# Patient Record
Sex: Male | Born: 1975
Health system: Southern US, Academic
[De-identification: ages and names within clinical notes are randomized; demographics above are authoritative.]

## PROBLEM LIST (undated history)

## (undated) ENCOUNTER — Encounter

## (undated) ENCOUNTER — Ambulatory Visit

## (undated) DIAGNOSIS — L309 Dermatitis, unspecified: Secondary | ICD-10-CM

## (undated) DIAGNOSIS — F32A Depression, unspecified: Secondary | ICD-10-CM

## (undated) DIAGNOSIS — F419 Anxiety disorder, unspecified: Secondary | ICD-10-CM

## (undated) DIAGNOSIS — F329 Major depressive disorder, single episode, unspecified: Secondary | ICD-10-CM

## (undated) DIAGNOSIS — J45909 Unspecified asthma, uncomplicated: Secondary | ICD-10-CM

## (undated) HISTORY — DX: Major depressive disorder, single episode, unspecified: F32.9

## (undated) HISTORY — DX: Anxiety disorder, unspecified: F41.9

## (undated) HISTORY — DX: Unspecified asthma, uncomplicated: J45.909

## (undated) HISTORY — DX: Depression, unspecified: F32.A

## (undated) HISTORY — DX: Dermatitis, unspecified: L30.9

---

## 2008-02-08 ENCOUNTER — Emergency Department (HOSPITAL_COMMUNITY): Admission: EM | Admit: 2008-02-08 | Discharge: 2008-02-08 | Payer: Self-pay | Admitting: Emergency Medicine

## 2010-04-28 LAB — POCT I-STAT, CHEM 8
BUN: 15 mg/dL (ref 6–23)
Calcium, Ion: 1.16 mmol/L (ref 1.12–1.32)
Chloride: 106 mEq/L (ref 96–112)
Glucose, Bld: 94 mg/dL (ref 70–99)
TCO2: 26 mmol/L (ref 0–100)

## 2012-06-07 ENCOUNTER — Telehealth: Payer: Self-pay | Admitting: Physician Assistant

## 2012-06-07 MED ORDER — SERTRALINE HCL 50 MG PO TABS: 50.0000 mg | ORAL_TABLET | Freq: Every day | ORAL | Status: DC

## 2012-06-07 NOTE — Telephone Encounter (Signed)
Pt has not been seen in over 1 year.  He was called and appt was made for this Friday.  #30 called in so pt will not run out.

## 2012-06-10 ENCOUNTER — Ambulatory Visit: Payer: BC Managed Care – PPO | Admitting: Family Medicine

## 2012-07-13 ENCOUNTER — Encounter: Payer: Self-pay | Admitting: Physician Assistant

## 2012-07-13 ENCOUNTER — Ambulatory Visit (INDEPENDENT_AMBULATORY_CARE_PROVIDER_SITE_OTHER): Payer: BC Managed Care – PPO | Admitting: Physician Assistant

## 2012-07-13 VITALS — BP 146/90 | HR 72 | Temp 98.0°F | Resp 16 | Ht 66.0 in | Wt 186.0 lb

## 2012-07-13 DIAGNOSIS — J4530 Mild persistent asthma, uncomplicated: Secondary | ICD-10-CM

## 2012-07-13 DIAGNOSIS — F32A Depression, unspecified: Secondary | ICD-10-CM | POA: Insufficient documentation

## 2012-07-13 DIAGNOSIS — J45909 Unspecified asthma, uncomplicated: Secondary | ICD-10-CM | POA: Insufficient documentation

## 2012-07-13 DIAGNOSIS — F329 Major depressive disorder, single episode, unspecified: Secondary | ICD-10-CM

## 2012-07-13 DIAGNOSIS — F419 Anxiety disorder, unspecified: Secondary | ICD-10-CM | POA: Insufficient documentation

## 2012-07-13 DIAGNOSIS — F411 Generalized anxiety disorder: Secondary | ICD-10-CM

## 2012-07-13 DIAGNOSIS — I10 Essential (primary) hypertension: Secondary | ICD-10-CM | POA: Insufficient documentation

## 2012-07-13 DIAGNOSIS — L259 Unspecified contact dermatitis, unspecified cause: Secondary | ICD-10-CM

## 2012-07-13 DIAGNOSIS — L309 Dermatitis, unspecified: Secondary | ICD-10-CM | POA: Insufficient documentation

## 2012-07-13 MED ORDER — TRIAMCINOLONE ACETONIDE 0.5 % EX CREA: TOPICAL_CREAM | Freq: Three times a day (TID) | CUTANEOUS | Status: DC

## 2012-07-13 MED ORDER — ALBUTEROL SULFATE HFA 108 (90 BASE) MCG/ACT IN AERS: 2.0000 | INHALATION_SPRAY | Freq: Four times a day (QID) | RESPIRATORY_TRACT | Status: DC | PRN

## 2012-07-13 MED ORDER — BUDESONIDE-FORMOTEROL FUMARATE 80-4.5 MCG/ACT IN AERO: 2.0000 | INHALATION_SPRAY | Freq: Two times a day (BID) | RESPIRATORY_TRACT | Status: DC

## 2012-07-13 MED ORDER — SERTRALINE HCL 50 MG PO TABS: 50.0000 mg | ORAL_TABLET | Freq: Every day | ORAL | Status: DC

## 2012-07-13 NOTE — Progress Notes (Signed)
Patient ID: Andres Jennings MRN: [id number], DOB: 11-05-1975, 37 y.o. Date of Encounter: @DATE @  Chief Complaint:  Chief Complaint  Patient presents with  . Medication Refill    HPI: 37 y.o. year old white male  presents for routine f/u. Hi LOV was 03/2011.   1- HTN: In past he had HTN.  He did diet, exercise, lost weight, and his BP became controlled. He notes that today his weight and BP are back up. Admits that he ahs slacked off his diet and exercise.  2-Anxiety/Depression: Is on Zoloft 50mg  QD. This is working well. This controls his mood. He is having no adv effects. He does not want to try going off the med again. He had stoppe dit in past but had to restart it in 02/2011.   3- Asthma:; He does use Symbicort routinely during spring and fall, "allergy season". He rarely has to use albuterol-almost never.   4-Eczema: Uses Rx cream to areas as needed.   Past Medical History  Diagnosis Date  . Anxiety   . Depression   . Asthma   . Eczema      Home Meds: See attached medication section for current medication list. Any medications entered into computer today will not appear on this note's list. The medications listed below were entered prior to today. No current outpatient prescriptions on file prior to visit.   No current facility-administered medications on file prior to visit.    Allergies: No Known Allergies  History   Social History  . Marital Status: Married    Spouse Name: N/A    Number of Children: N/A  . Years of Education: N/A   Occupational History  . Not on file.   Social History Main Topics  . Smoking status: Never Smoker   . Smokeless tobacco: Not on file  . Alcohol Use: Yes     Comment: Occasional  . Drug Use: No  . Sexually Active: Not on file   Other Topics Concern  . Not on file   Social History Narrative  . No narrative on file    No family history on file.   Review of Systems:  See HPI for pertinent ROS. All other ROS negative.     Physical Exam: Blood pressure 146/90, pulse 72, temperature 98 F (36.7 C), temperature source Oral, resp. rate 16, height 5\' 6"  (1.676 m), weight 186 lb (84.369 kg)., Body mass index is 30.04 kg/(m^2). General:WNWD WM. Appears in no acute distress. Neck: Supple. No thyromegaly. No lymphadenopathy. No carotid bruit. Lungs: Clear bilaterally to auscultation without wheezes, rales, or rhonchi. Breathing is unlabored. Heart: RRR with S1 S2. No murmurs, rubs, or gallops. Musculoskeletal:  Strength and tone normal for age. Extremities/Skin: Warm and dry. No LE edema. No active eczema at present. Neuro: Alert and oriented X 3. Moves all extremities spontaneously. Gait is normal. CNII-XII grossly in tact. Psych:  Responds to questions appropriately with a normal affect.     ASSESSMENT AND PLAN:  37 y.o. year old male with  1. Anxiety Stable/controlled. Cont current med. - sertraline (ZOLOFT) 50 MG tablet; Take 1 tablet (50 mg total) by mouth daily.  Dispense: 30 tablet; Refill: 11  2. Depression Stable/controlled. Cont current med.  - sertraline (ZOLOFT) 50 MG tablet; Take 1 tablet (50 mg total) by mouth daily.  Dispense: 30 tablet; Refill: 11  3. HTN (hypertension) He wants to treat with diet, exercise. He says it is realistic that he can do this long term. He will  make these changes then come in for f/u OV in 2 months to recheck.   4. Asthma, mild persistent, uncomplicated Stable/controlled. Cont current meds.  - budesonide-formoterol (SYMBICORT) 80-4.5 MCG/ACT inhaler; Inhale 2 puffs into the lungs 2 (two) times daily.  Dispense: 1 Inhaler; Refill: 11 - albuterol (PROVENTIL HFA;VENTOLIN HFA) 108 (90 BASE) MCG/ACT inhaler; Inhale 2 puffs into the lungs every 6 (six) hours as needed for wheezing.  Dispense: 1 Inhaler; Refill: 0  5. Eczema - triamcinolone cream (KENALOG) 0.5 %; Apply topically 3 (three) times daily.  Dispense: 30 g; Refill: 3  FLP was ok 06/2009.   [address] [place], [place], [facility] 07/13/2012 8:34 AM

## 2012-09-14 ENCOUNTER — Ambulatory Visit: Payer: BC Managed Care – PPO | Admitting: Physician Assistant

## 2012-12-23 ENCOUNTER — Other Ambulatory Visit: Payer: Self-pay | Admitting: Physician Assistant

## 2013-06-21 ENCOUNTER — Other Ambulatory Visit: Payer: BC Managed Care – PPO

## 2013-06-22 ENCOUNTER — Encounter: Payer: Self-pay | Admitting: Family Medicine

## 2013-06-22 ENCOUNTER — Ambulatory Visit (INDEPENDENT_AMBULATORY_CARE_PROVIDER_SITE_OTHER): Payer: BC Managed Care – PPO | Admitting: Family Medicine

## 2013-06-22 VITALS — BP 140/94 | HR 80 | Temp 98.4°F | Resp 16 | Ht 66.0 in | Wt 174.0 lb

## 2013-06-22 DIAGNOSIS — F411 Generalized anxiety disorder: Secondary | ICD-10-CM

## 2013-06-22 DIAGNOSIS — L309 Dermatitis, unspecified: Secondary | ICD-10-CM

## 2013-06-22 DIAGNOSIS — F3289 Other specified depressive episodes: Secondary | ICD-10-CM

## 2013-06-22 DIAGNOSIS — L259 Unspecified contact dermatitis, unspecified cause: Secondary | ICD-10-CM

## 2013-06-22 DIAGNOSIS — Z13220 Encounter for screening for lipoid disorders: Secondary | ICD-10-CM

## 2013-06-22 DIAGNOSIS — F32A Depression, unspecified: Secondary | ICD-10-CM

## 2013-06-22 DIAGNOSIS — F329 Major depressive disorder, single episode, unspecified: Secondary | ICD-10-CM

## 2013-06-22 DIAGNOSIS — F419 Anxiety disorder, unspecified: Secondary | ICD-10-CM

## 2013-06-22 MED ORDER — BUDESONIDE-FORMOTEROL FUMARATE 80-4.5 MCG/ACT IN AERO: 2.0000 | INHALATION_SPRAY | Freq: Two times a day (BID) | RESPIRATORY_TRACT | Status: DC

## 2013-06-22 MED ORDER — TRIAMCINOLONE ACETONIDE 0.1 % EX CREA: 1.0000 "application " | TOPICAL_CREAM | Freq: Two times a day (BID) | CUTANEOUS | Status: DC

## 2013-06-22 MED ORDER — SERTRALINE HCL 50 MG PO TABS: 50.0000 mg | ORAL_TABLET | Freq: Every day | ORAL | Status: DC

## 2013-06-22 NOTE — Progress Notes (Signed)
Subjective:    Patient ID: Andres Jennings, male    DOB: [birth year], 38 y.o.   MRN: [id number]  HPI Patient has a history of eczema. He uses triamcinolone cream twice daily for 3 or 4 days out of the month. He then uses moisturizers on a daily basis. He like a refill on triamcinolone. He also has a history of asthma. He Symbicort 80/40.52 puffs inhaled twice a day. This is working well. He uses albuterol less than 2 nights out of the month and less than 2 times a week. He also has a history of anxiety disorder and panic attacks. He is Zoloft 50 mg by mouth daily. On this dose he has not had a panic attack in over a year. He would like to continue that.  His blood pressure is elevated today. When I rechecked his blood pressure was 138/80. The patient has an element of white coat syndrome. His blood pressure at home is much better 110-120 over 70s. He checks it on a daily basis. He requests a screening cholesterol level. He has a family history of heart disease. Dad had MI in 2340's. Past Medical History  Diagnosis Date  . Anxiety   . Depression   . Asthma   . Eczema    Current Outpatient Prescriptions on File Prior to Visit  Medication Sig Dispense Refill  . albuterol (PROVENTIL HFA;VENTOLIN HFA) 108 (90 BASE) MCG/ACT inhaler Inhale 2 puffs into the lungs every 6 (six) hours as needed for wheezing.  1 Inhaler  0  . triamcinolone cream (KENALOG) 0.5 % APPLY 3 TIMES A DAY  30 g  3   No current facility-administered medications on file prior to visit.   No Known Allergies History   Social History  . Marital Status: Married    Spouse Name: N/A    Number of Children: N/A  . Years of Education: N/A   Occupational History  . Not on file.   Social History Main Topics  . Smoking status: Never Smoker   . Smokeless tobacco: Not on file  . Alcohol Use: Yes     Comment: Occasional  . Drug Use: No  . Sexual Activity: Not on file   Other Topics Concern  . Not on file   Social History  Narrative  . No narrative on file   No family history on file.    Review of Systems  All other systems reviewed and are negative.      Objective:   Physical Exam  Vitals reviewed. Cardiovascular: Normal rate, regular rhythm and normal heart sounds.   Pulmonary/Chest: Effort normal and breath sounds normal. No respiratory distress. He has no wheezes. He has no rales.  Abdominal: Soft. Bowel sounds are normal. He exhibits no distension. There is no tenderness. There is no rebound and no guarding.  Skin: Rash noted. There is erythema.          Assessment & Plan:  1. Eczema Patient can use triamcinolone cream up to 2 pounds a day as needed for eczema. I cautioned him against overuse of this medication. I explained that this can cause thinning of the skin - triamcinolone cream (KENALOG) 0.1 %; Apply 1 application topically 2 (two) times daily.  Dispense: 454 g; Refill: 0  2. Anxiety - sertraline (ZOLOFT) 50 MG tablet; Take 1 tablet (50 mg total) by mouth daily.  Dispense: 90 tablet; Refill: 3  3. Depression Patient like to continue Zoloft 50 mg by mouth daily because it is working well.  He will continue to check his blood pressure on the daily basis at home. He will notify me if his blood pressure is consistently greater than 140/90. - sertraline (ZOLOFT) 50 MG tablet; Take 1 tablet (50 mg total) by mouth daily.  Dispense: 90 tablet; Refill: 3  4. Screening cholesterol level Return fasting for a fasting lipid panel. - COMPLETE METABOLIC PANEL WITH GFR; Future - Lipid panel; Future 5. Asthma- I refilled the patient's Symbicort. His asthma is currently well controlled

## 2013-07-25 ENCOUNTER — Other Ambulatory Visit: Payer: Self-pay | Admitting: Physician Assistant

## 2013-07-25 NOTE — Telephone Encounter (Signed)
Refill appropriate and filled per protocol. 

## 2013-09-19 ENCOUNTER — Ambulatory Visit (INDEPENDENT_AMBULATORY_CARE_PROVIDER_SITE_OTHER): Payer: BC Managed Care – PPO | Admitting: Family Medicine

## 2013-09-19 ENCOUNTER — Encounter: Payer: Self-pay | Admitting: Family Medicine

## 2013-09-19 VITALS — BP 134/82 | HR 82 | Temp 98.0°F | Resp 18 | Ht 66.0 in | Wt 173.0 lb

## 2013-09-19 DIAGNOSIS — H6980 Other specified disorders of Eustachian tube, unspecified ear: Secondary | ICD-10-CM

## 2013-09-19 DIAGNOSIS — H6500 Acute serous otitis media, unspecified ear: Secondary | ICD-10-CM

## 2013-09-19 DIAGNOSIS — H6503 Acute serous otitis media, bilateral: Secondary | ICD-10-CM

## 2013-09-19 DIAGNOSIS — H6983 Other specified disorders of Eustachian tube, bilateral: Secondary | ICD-10-CM

## 2013-09-19 MED ORDER — PREDNISONE 20 MG PO TABS: ORAL_TABLET | ORAL | Status: DC

## 2013-09-19 MED ORDER — CEFDINIR 300 MG PO CAPS: 300.0000 mg | ORAL_CAPSULE | Freq: Two times a day (BID) | ORAL | Status: DC

## 2013-09-19 NOTE — Progress Notes (Signed)
Subjective:    Patient ID: Andres Jennings, male    DOB: Mar 18, 1975, 38 y.o.   MRN: [id number]  HPI 2 weeks ago, the patient developed upper respiratory infection.  It is characterized by runny nose, sore throat, nonproductive cough. Patient took over-the-counter medications for symptoms.  However, starting Friday, the patient developed bilateral hearing impairment.  He states that both ears feel full like he is underwater.  Voices and sounds are muffled.  He can hear them but they are diminished.  [facility] he was diagnosed with bilateral ear infections and was given amoxicillin.  He's been on the medication now 4 days but symptoms have not improved.  He denies dizziness. He is having some mild tinnitus. He also reports sinus pressure and drainage. Past Medical History  Diagnosis Date  . Anxiety   . Depression   . Asthma   . Eczema    No past surgical history on file. Current Outpatient Prescriptions on File Prior to Visit  Medication Sig Dispense Refill  . sertraline (ZOLOFT) 50 MG tablet TAKE 1 TABLET EVERY DAY  30 tablet  2  . SYMBICORT 80-4.5 MCG/ACT inhaler INHALE 2 PUFFS INTO THE LUNGS 2 (TWO) TIMES DAILY.  10.2 g  3  . triamcinolone cream (KENALOG) 0.1 % Apply 1 application topically 2 (two) times daily.  454 g  0  . albuterol (PROVENTIL HFA;VENTOLIN HFA) 108 (90 BASE) MCG/ACT inhaler Inhale 2 puffs into the lungs every 6 (six) hours as needed for wheezing.  1 Inhaler  0   No current facility-administered medications on file prior to visit.   No Known Allergies History   Social History  . Marital Status: Married    Spouse Name: N/A    Number of Children: N/A  . Years of Education: N/A   Occupational History  . Not on file.   Social History Main Topics  . Smoking status: Never Smoker   . Smokeless tobacco: Not on file  . Alcohol Use: Yes     Comment: Occasional  . Drug Use: No  . Sexual Activity: Not on file   Other Topics Concern  . Not on  file   Social History Narrative  . No narrative on file      Review of Systems  All other systems reviewed and are negative.      Objective:   Physical Exam  Vitals reviewed. Constitutional: He appears well-developed and well-nourished.  HENT:  Right Ear: External ear and ear canal normal. Tympanic membrane is injected and erythematous. A middle ear effusion is present. Decreased hearing is noted.  Left Ear: External ear and ear canal normal. Tympanic membrane is injected and erythematous. A middle ear effusion is present. Decreased hearing is noted.  Mouth/Throat: Oropharynx is clear and moist. No oropharyngeal exudate.  Eyes: Conjunctivae are normal. No scleral icterus.  Neck: Neck supple.  Cardiovascular: Normal rate, regular rhythm and normal heart sounds.   Pulmonary/Chest: Effort normal and breath sounds normal. No respiratory distress. He has no wheezes. He has no rales.  Lymphadenopathy:    He has no cervical adenopathy.          Assessment & Plan:  Bilateral acute serous otitis media, recurrence not specified - Plan: cefdinir (OMNICEF) 300 MG capsule, DISCONTINUED: cefdinir (OMNICEF) 300 MG capsule  Eustachian tube dysfunction, bilateral - Plan: predniSONE (DELTASONE) 20 MG tablet, DISCONTINUED: predniSONE (DELTASONE) 20 MG tablet  Extend antibiotic coverage with Omnicef and have the patient discontinue amoxicillin. He  will take Omnicef 300 mg by mouth twice a day for 10 days. I also encouraged him to take Sudafed 60 mg every 6 hours.  Allso start the patient on prednisone taper pack to try to help with eustachian tube dysfunction by decreasing sinus inflammation and swelling. The patient has been taking Flonase without benefit. Recheck in one week and if no better consult ENT.  I do not believe the patient has sudden sense and no hearing loss the patient still has hearing in his left ear albeit diminished.

## 2013-10-06 ENCOUNTER — Telehealth: Payer: Self-pay | Admitting: Family Medicine

## 2013-10-06 DIAGNOSIS — J4530 Mild persistent asthma, uncomplicated: Secondary | ICD-10-CM

## 2013-10-06 MED ORDER — ALBUTEROL SULFATE HFA 108 (90 BASE) MCG/ACT IN AERS: 2.0000 | INHALATION_SPRAY | Freq: Four times a day (QID) | RESPIRATORY_TRACT | Status: DC | PRN

## 2013-10-06 NOTE — Telephone Encounter (Signed)
Medication refilled per protocol. 

## 2013-10-20 ENCOUNTER — Telehealth: Payer: Self-pay | Admitting: Family Medicine

## 2013-10-20 NOTE — Telephone Encounter (Signed)
States he discussed with you a few weeks ago at office visit.  Traveling out of country ([place] next week.  Wants Ambien to help him sleep.  You said you could do??

## 2013-10-23 MED ORDER — ZOLPIDEM TARTRATE 10 MG PO TABS: 10.0000 mg | ORAL_TABLET | Freq: Every evening | ORAL | Status: DC | PRN

## 2013-10-23 NOTE — Telephone Encounter (Signed)
Ok with ambien 10 mg poqhs prn #30

## 2013-10-23 NOTE — Telephone Encounter (Signed)
Rx called in.  Left pt message Rx called in

## 2013-11-22 ENCOUNTER — Other Ambulatory Visit: Payer: Self-pay | Admitting: Family Medicine

## 2013-11-23 NOTE — Telephone Encounter (Signed)
Refill appropriate and filled per protocol. 

## 2014-04-22 ENCOUNTER — Other Ambulatory Visit: Payer: Self-pay | Admitting: Family Medicine

## 2014-08-06 ENCOUNTER — Other Ambulatory Visit: Payer: Self-pay | Admitting: Family Medicine

## 2014-09-01 ENCOUNTER — Other Ambulatory Visit: Payer: Self-pay | Admitting: Family Medicine

## 2015-02-06 ENCOUNTER — Other Ambulatory Visit: Payer: Self-pay | Admitting: Family Medicine

## 2015-02-06 MED ORDER — TRIAMCINOLONE ACETONIDE 0.1 % EX CREA: TOPICAL_CREAM | CUTANEOUS | Status: DC

## 2015-02-06 NOTE — Telephone Encounter (Signed)
Medication called/sent to requested pharmacy  

## 2015-05-14 ENCOUNTER — Other Ambulatory Visit: Payer: Self-pay | Admitting: Family Medicine

## 2015-05-14 NOTE — Telephone Encounter (Signed)
Medication filled x1 with no refills.   Requires office visit before any further refills can be given.   Letter sent.  

## 2015-05-29 ENCOUNTER — Other Ambulatory Visit: Payer: Self-pay | Admitting: *Deleted

## 2015-05-29 MED ORDER — TRIAMCINOLONE ACETONIDE 0.1 % EX CREA: TOPICAL_CREAM | CUTANEOUS | Status: DC

## 2015-05-29 NOTE — Telephone Encounter (Signed)
Received fax requesting refill on Triamcinolone.   Refill appropriate and filled per protocol.

## 2015-06-18 ENCOUNTER — Other Ambulatory Visit: Payer: Self-pay | Admitting: Family Medicine

## 2015-06-24 ENCOUNTER — Ambulatory Visit (INDEPENDENT_AMBULATORY_CARE_PROVIDER_SITE_OTHER): Payer: BLUE CROSS/BLUE SHIELD | Admitting: Family Medicine

## 2015-06-24 ENCOUNTER — Encounter: Payer: Self-pay | Admitting: Family Medicine

## 2015-06-24 VITALS — BP 132/78 | HR 68 | Temp 98.1°F | Resp 16 | Wt 169.0 lb

## 2015-06-24 DIAGNOSIS — I10 Essential (primary) hypertension: Secondary | ICD-10-CM

## 2015-06-24 DIAGNOSIS — J4530 Mild persistent asthma, uncomplicated: Secondary | ICD-10-CM

## 2015-06-24 DIAGNOSIS — F411 Generalized anxiety disorder: Secondary | ICD-10-CM | POA: Diagnosis not present

## 2015-06-24 MED ORDER — ALBUTEROL SULFATE HFA 108 (90 BASE) MCG/ACT IN AERS: 2.0000 | INHALATION_SPRAY | Freq: Four times a day (QID) | RESPIRATORY_TRACT | Status: DC | PRN

## 2015-06-24 MED ORDER — SERTRALINE HCL 50 MG PO TABS: 50.0000 mg | ORAL_TABLET | Freq: Every day | ORAL | Status: DC

## 2015-06-24 MED ORDER — BUDESONIDE-FORMOTEROL FUMARATE 80-4.5 MCG/ACT IN AERO: INHALATION_SPRAY | RESPIRATORY_TRACT | Status: DC

## 2015-06-24 NOTE — Progress Notes (Signed)
Subjective:    Patient ID: Andres Jennings, male    DOB: 03/14/1975, 40 y.o.   MRN: [id number]  HPI  Patient has been on Zoloft since 2013 for anxiety. He denies any anxiety attacks in several years. He is not even sure that he needs Zoloft anymore. He is also on Symbicort 80/4.5, 2 puffs inhaled twice daily. However uses the medication once daily and sometimes forgets to take the medication at all. He requires albuterol maybe 1 or 2 times per month. He denies any chest pain shortness of breath or dyspnea on exertion. He does have a significant family history of colon cancer with a maternal aunt who died from colon cancer in her early 42s. He denies any blood in his stool or stomach problems. He is here today for refills on his medication. He also has a history of severe eczema. He uses triamcinolone cream 1 or 2 times per week. He uses Cetaphil cream twice a day as a preventative Past Medical History  Diagnosis Date  . Anxiety   . Depression   . Asthma   . Eczema    No past surgical history on file. Current Outpatient Prescriptions on File Prior to Visit  Medication Sig Dispense Refill  . triamcinolone cream (KENALOG) 0.1 % APPLY 1 APPLICATION TOPICALLY 2 (TWO) TIMES DAILY. 453 g 0  . fluticasone (FLONASE) 50 MCG/ACT nasal spray Reported on 06/24/2015  5   No current facility-administered medications on file prior to visit.   No Known Allergies Social History   Social History  . Marital Status: Married    Spouse Name: N/A  . Number of Children: N/A  . Years of Education: N/A   Occupational History  . Not on file.   Social History Main Topics  . Smoking status: Never Smoker   . Smokeless tobacco: Not on file  . Alcohol Use: Yes     Comment: Occasional  . Drug Use: No  . Sexual Activity: Not on file   Other Topics Concern  . Not on file   Social History Narrative     Review of Systems  All other systems reviewed and are negative.      Objective:   Physical Exam    Cardiovascular: Normal rate and regular rhythm.  Exam reveals no gallop and no friction rub.   No murmur heard. Pulmonary/Chest: Effort normal and breath sounds normal. No respiratory distress. He has no wheezes. He has no rales.  Abdominal: Soft. Bowel sounds are normal. He exhibits no distension. There is no tenderness. There is no rebound and no guarding.  Psychiatric: He has a normal mood and affect. His behavior is normal. Judgment and thought content normal.  Vitals reviewed.         Assessment & Plan:  Asthma, mild persistent, uncomplicated - Plan: albuterol (PROVENTIL HFA;VENTOLIN HFA) 108 (90 Base) MCG/ACT inhaler  Benign essential HTN  Anxiety state  Continue Symbicort 80/4.5, 2 puffs inhaled twice daily. Use albuterol as necessary. Blood pressures borderline. I've asked the patient to monitor his blood pressure and notify me if his blood pressure is greater than 140/90. Return fasting for a CBC, CMP, fasting lipid panel. I recommended that the patient try weaning off Zoloft decreasing the dosage by 50% every 2 weeks until he is off medication. We discussed how to do this. I'm not sure that he truly needs that medication any longer. He is interested in trying to stop it. Because of his family history I will arrange for the  patient to undergo cologuard testing.

## 2015-06-25 ENCOUNTER — Other Ambulatory Visit: Payer: BLUE CROSS/BLUE SHIELD

## 2015-06-25 DIAGNOSIS — F419 Anxiety disorder, unspecified: Secondary | ICD-10-CM

## 2015-06-25 DIAGNOSIS — Z79899 Other long term (current) drug therapy: Secondary | ICD-10-CM

## 2015-06-25 DIAGNOSIS — I10 Essential (primary) hypertension: Secondary | ICD-10-CM

## 2015-06-25 LAB — COMPLETE METABOLIC PANEL WITH GFR
ALBUMIN: 4.2 g/dL (ref 3.6–5.1)
ALK PHOS: 26 U/L — AB (ref 40–115)
ALT: 21 U/L (ref 9–46)
AST: 16 U/L (ref 10–40)
BILIRUBIN TOTAL: 1.3 mg/dL — AB (ref 0.2–1.2)
BUN: 18 mg/dL (ref 7–25)
CALCIUM: 9.3 mg/dL (ref 8.6–10.3)
CO2: 27 mmol/L (ref 20–31)
Chloride: 106 mmol/L (ref 98–110)
Creat: 1.01 mg/dL (ref 0.60–1.35)
GFR, Est African American: >89 mL/min (ref >=60)
GLUCOSE: 81 mg/dL (ref 70–99)
Potassium: 4.8 mmol/L (ref 3.5–5.3)
SODIUM: 141 mmol/L (ref 135–146)
TOTAL PROTEIN: 6.7 g/dL (ref 6.1–8.1)

## 2015-06-25 LAB — CBC WITH DIFFERENTIAL/PLATELET
BASOS PCT: 0 %
Basophils Absolute: 0 cells/uL (ref 0–200)
Eosinophils Absolute: 172 cells/uL (ref 15–500)
Eosinophils Relative: 4 %
HCT: 45.2 % (ref 38.5–50.0)
Hemoglobin: 15.3 g/dL (ref 13.0–17.0)
LYMPHS ABS: 1763 {cells}/uL (ref 850–3900)
Lymphocytes Relative: 41 %
MCH: 30.8 pg (ref 27.0–33.0)
MCHC: 33.8 g/dL (ref 32.0–36.0)
MCV: 90.9 fL (ref 80.0–100.0)
MONO ABS: 430 {cells}/uL (ref 200–950)
MPV: 9.7 fL (ref 7.5–12.5)
Monocytes Relative: 10 %
NEUTROS PCT: 45 %
Neutro Abs: 1935 cells/uL (ref 1500–7800)
PLATELETS: 200 10*3/uL (ref 140–400)
RBC: 4.97 MIL/uL (ref 4.20–5.80)
RDW: 13.4 % (ref 11.0–15.0)
WBC: 4.3 10*3/uL (ref 3.8–10.8)

## 2015-06-25 LAB — LIPID PANEL
CHOLESTEROL: 160 mg/dL (ref 125–200)
HDL: 55 mg/dL (ref >=40)
LDL Cholesterol: 88 mg/dL (ref <130)
Total CHOL/HDL Ratio: 2.9 Ratio (ref <=5.0)
Triglycerides: 86 mg/dL (ref <150)
VLDL: 17 mg/dL (ref <30)

## 2015-07-01 ENCOUNTER — Encounter: Payer: Self-pay | Admitting: Family Medicine

## 2015-08-23 ENCOUNTER — Other Ambulatory Visit: Payer: Self-pay | Admitting: Family Medicine

## 2015-08-23 MED ORDER — SERTRALINE HCL 50 MG PO TABS: 50.0000 mg | ORAL_TABLET | Freq: Every day | ORAL | 3 refills | Status: DC

## 2015-08-23 NOTE — Telephone Encounter (Signed)
Medication called/sent to requested pharmacy  

## 2015-10-13 ENCOUNTER — Other Ambulatory Visit: Payer: Self-pay | Admitting: Family Medicine

## 2015-10-16 DIAGNOSIS — Z23 Encounter for immunization: Secondary | ICD-10-CM | POA: Diagnosis not present

## 2015-10-16 DIAGNOSIS — Z1389 Encounter for screening for other disorder: Secondary | ICD-10-CM | POA: Diagnosis not present

## 2015-10-16 DIAGNOSIS — Z8249 Family history of ischemic heart disease and other diseases of the circulatory system: Secondary | ICD-10-CM | POA: Diagnosis not present

## 2015-10-16 DIAGNOSIS — Z0000 Encounter for general adult medical examination without abnormal findings: Secondary | ICD-10-CM | POA: Diagnosis not present

## 2015-10-16 DIAGNOSIS — J45998 Other asthma: Secondary | ICD-10-CM | POA: Diagnosis not present

## 2015-10-16 DIAGNOSIS — Z125 Encounter for screening for malignant neoplasm of prostate: Secondary | ICD-10-CM | POA: Diagnosis not present

## 2015-12-08 DIAGNOSIS — S81811A Laceration without foreign body, right lower leg, initial encounter: Secondary | ICD-10-CM | POA: Diagnosis not present

## 2016-01-30 ENCOUNTER — Other Ambulatory Visit: Payer: Self-pay | Admitting: Family Medicine

## 2016-01-31 NOTE — Telephone Encounter (Signed)
Medication refilled per protocol. 

## 2016-05-26 ENCOUNTER — Other Ambulatory Visit: Payer: Self-pay | Admitting: Family Medicine

## 2016-07-01 ENCOUNTER — Other Ambulatory Visit: Payer: Self-pay | Admitting: Family Medicine

## 2016-08-23 ENCOUNTER — Other Ambulatory Visit: Payer: Self-pay | Admitting: Family Medicine

## 2016-08-26 ENCOUNTER — Other Ambulatory Visit: Payer: Self-pay | Admitting: Family Medicine

## 2016-09-14 ENCOUNTER — Other Ambulatory Visit: Payer: Self-pay | Admitting: Family Medicine

## 2017-02-01 ENCOUNTER — Other Ambulatory Visit: Payer: Self-pay | Admitting: Family Medicine

## 2017-03-17 DIAGNOSIS — F3289 Other specified depressive episodes: Secondary | ICD-10-CM | POA: Diagnosis not present

## 2017-03-23 DIAGNOSIS — L03115 Cellulitis of right lower limb: Secondary | ICD-10-CM | POA: Diagnosis not present

## 2017-03-31 ENCOUNTER — Other Ambulatory Visit: Payer: Self-pay | Admitting: Family Medicine

## 2017-03-31 MED ORDER — BUDESONIDE-FORMOTEROL FUMARATE 80-4.5 MCG/ACT IN AERO: INHALATION_SPRAY | RESPIRATORY_TRACT | 0 refills | Status: DC

## 2017-03-31 NOTE — Telephone Encounter (Signed)
Requesting refill on Symbicort - refilled x 1 and pt needs ov as it has been over a year since lov - pharmacy made aware

## 2017-04-28 DIAGNOSIS — F3289 Other specified depressive episodes: Secondary | ICD-10-CM | POA: Diagnosis not present

## 2017-07-02 ENCOUNTER — Encounter: Payer: Self-pay | Admitting: Family Medicine

## 2017-07-02 ENCOUNTER — Ambulatory Visit: Payer: BLUE CROSS/BLUE SHIELD | Admitting: Family Medicine

## 2017-07-02 VITALS — BP 130/82 | HR 71 | Temp 98.2°F | Resp 16 | Wt 183.0 lb

## 2017-07-02 DIAGNOSIS — Z760 Encounter for issue of repeat prescription: Secondary | ICD-10-CM | POA: Diagnosis not present

## 2017-07-02 DIAGNOSIS — L309 Dermatitis, unspecified: Secondary | ICD-10-CM

## 2017-07-02 MED ORDER — TRIAMCINOLONE ACETONIDE 0.1 % EX CREA: TOPICAL_CREAM | CUTANEOUS | 0 refills | Status: DC

## 2017-07-02 MED ORDER — KETOCONAZOLE 2 % EX SHAM: 1.0000 "application " | MEDICATED_SHAMPOO | CUTANEOUS | 0 refills | Status: DC

## 2017-07-02 NOTE — Progress Notes (Signed)
Patient ID: Andres Jennings, male    DOB: 1975/07/06, 42 y.o.   MRN: [id number]  PCP: Andres Jennings, Andres Jennings, Andres Jennings  Chief Complaint  Patient presents with  . Eczema    Patient just needs a refill on  Kenalog cream    Subjective:   Andres KinsJason Rougeau is a 42 y.o. male, presents to clinic with CC of eczema with need for steroid cream refill.  He maintains his eczema with kenalog, Aquaphor, Cetaphil.  He states that he uses Kenalog cream sparingly using it occasionally every week but not daily.  He uses a tub 2 times a year.  He complains of eczema and rash to his scalp where he has difficulty getting the cream to, and asked if there is any other treatments for this.  He has been to dermatology in the past.  He has never been on any oral medications to treat this per his report.  He does not have any broken swollen draining or painful skin currently.  He is an established patient here last visit was roughly 2 years ago.  He was considering relocating to a different family practice that was closer to him but they were unable to see him for 6 months plus so he is return here for us to manage this for him.  Patient Active Problem List   Diagnosis Date Noted  . HTN (hypertension) 07/13/2012  . Anxiety   . Depression   . Asthma   . Eczema      Prior to Admission medications   Medication Sig Start Date End Date Taking? Authorizing Provider  albuterol (PROVENTIL HFA;VENTOLIN HFA) 108 (90 Base) MCG/ACT inhaler Inhale 2 puffs into the lungs every 6 (six) hours as needed for wheezing. 06/24/15  Yes Andres Jennings, Andres Jennings, Andres Jennings  budesonide-formoterol (SYMBICORT) 80-4.5 MCG/ACT inhaler INHALE 2 PUFFS INTO THE LUNGS 2 (TWO) TIMES DAILY. 03/31/17  Yes Andres Jennings, Andres Jennings, Andres Jennings  fluticasone Aleda Grana(FLONASE) 50 MCG/ACT nasal spray Reported on 06/24/2015 10/23/13  Yes Provider, Historical, Andres Jennings  sertraline (ZOLOFT) 50 MG tablet TAKE 1 TABLET (50 MG TOTAL) BY MOUTH DAILY. 08/26/16  Yes Andres Jennings, Andres Jennings, Andres Jennings  triamcinolone cream (KENALOG)  0.1 % APPLY TO AFFECTED AREAS TWICE DAILY 02/01/17  Yes Andres Jennings, Andres Jennings, Andres Jennings     No Known Allergies   History reviewed. No pertinent family history.   Social History   Socioeconomic History  . Marital status: Married    Spouse name: Not on file  . Number of children: Not on file  . Years of education: Not on file  . Highest education level: Not on file  Occupational History  . Not on file  Social Needs  . Financial resource strain: Not on file  . Food insecurity:    Worry: Not on file    Inability: Not on file  . Transportation needs:    Medical: Not on file    Non-medical: Not on file  Tobacco Use  . Smoking status: Never Smoker  Substance and Sexual Activity  . Alcohol use: Yes    Comment: Occasional  . Drug use: No  . Sexual activity: Not on file  Lifestyle  . Physical activity:    Days per week: Not on file    Minutes per session: Not on file  . Stress: Not on file  Relationships  . Social connections:    Talks on phone: Not on file    Gets together: Not on file    Attends religious service: Not on file  Active member of club or organization: Not on file    Attends meetings of clubs or organizations: Not on file    Relationship status: Not on file  . Intimate partner violence:    Fear of current or ex partner: Not on file    Emotionally abused: Not on file    Physically abused: Not on file    Forced sexual activity: Not on file  Other Topics Concern  . Not on file  Social History Narrative  . Not on file     Review of Systems  Constitutional: Negative.   HENT: Negative.   Respiratory: Negative.   Cardiovascular: Negative.   Musculoskeletal: Negative.   Skin: Negative for color change, pallor and wound.  Allergic/Immunologic: Negative.   Hematological: Negative.   All other systems reviewed and are negative.      Objective:    Vitals:   07/02/17 1611  BP: 130/82  Pulse: 71  Resp: 16  Temp: 98.2 F (36.8 C)  TempSrc: Oral  SpO2:  97%  Weight: 183 lb (83 kg)      Physical Exam  Constitutional: He appears well-developed.  HENT:  Head: Normocephalic and atraumatic.  Nose: Nose normal.  Eyes: Conjunctivae are normal. Right eye exhibits no discharge. Left eye exhibits no discharge.  Neck: No tracheal deviation present.  Cardiovascular: Normal rate and regular rhythm.  Pulmonary/Chest: Effort normal. No stridor. No respiratory distress.  Musculoskeletal: Normal range of motion.  Neurological: He is alert. He exhibits normal muscle tone. Coordination normal.  Skin: Skin is warm and dry. Capillary refill takes less than 2 seconds. Rash noted. There is erythema.  Thickened red patches to his posterior hairline without any tenderness or induration, no drainage Skin his bilateral upper extremities hands wrist AC fossa diffusely dry, cracked thin and brittle appearing, no true rash papules or macules, no concerning broken skin, no edema, induration, excoriations or drainage  Psychiatric: He has a normal mood and affect. His behavior is normal.  Nursing note and vitals reviewed.         Assessment & Plan:      ICD-10-CM   1. Eczema, unspecified type L30.9   2. Encounter for medication refill Z76.0   Patient request med refill of Kenalog cream for eczema.  No concerning rash, broken skin or secondary infection.  He states this is his baseline and this is his skin looking fairly healthy.  I did stress with him the importance of applying ointment/Vaseline to his skin especially after showering to seal in moisture, this should prevent him from having some any flares and using some much steroid cream.  He generally does not like using ointment, but agrees to trying it more often than he has, compromised for every other evening.   Do feel that the plaques in his scalp appear more concerning for seborrheic dermatitis.  We will try ketoconazole shampoo.  He states he is tried everything else over-the-counter like Selsun Blue.   May need dermatology referral?    Danelle Berry, Cordelia Poche 07/02/17 4:16 PM

## 2017-08-16 ENCOUNTER — Other Ambulatory Visit: Payer: Self-pay | Admitting: Family Medicine

## 2017-08-24 DIAGNOSIS — F3289 Other specified depressive episodes: Secondary | ICD-10-CM | POA: Diagnosis not present

## 2017-08-27 DIAGNOSIS — Y92312 Tennis court as the place of occurrence of the external cause: Secondary | ICD-10-CM | POA: Diagnosis not present

## 2017-08-27 DIAGNOSIS — Y9373 Activity, racquet and hand sports: Secondary | ICD-10-CM | POA: Diagnosis not present

## 2017-08-27 DIAGNOSIS — J45909 Unspecified asthma, uncomplicated: Secondary | ICD-10-CM | POA: Diagnosis not present

## 2017-08-27 DIAGNOSIS — W1839XA Other fall on same level, initial encounter: Secondary | ICD-10-CM | POA: Diagnosis not present

## 2017-08-27 DIAGNOSIS — Y998 Other external cause status: Secondary | ICD-10-CM | POA: Diagnosis not present

## 2017-08-27 DIAGNOSIS — S61210A Laceration without foreign body of right index finger without damage to nail, initial encounter: Secondary | ICD-10-CM | POA: Diagnosis not present

## 2017-08-29 ENCOUNTER — Other Ambulatory Visit: Payer: Self-pay | Admitting: Family Medicine

## 2017-08-31 DIAGNOSIS — F3289 Other specified depressive episodes: Secondary | ICD-10-CM | POA: Diagnosis not present

## 2017-10-29 DIAGNOSIS — F4323 Adjustment disorder with mixed anxiety and depressed mood: Secondary | ICD-10-CM | POA: Diagnosis not present

## 2017-11-03 DIAGNOSIS — F4323 Adjustment disorder with mixed anxiety and depressed mood: Secondary | ICD-10-CM | POA: Diagnosis not present

## 2017-11-10 DIAGNOSIS — F4323 Adjustment disorder with mixed anxiety and depressed mood: Secondary | ICD-10-CM | POA: Diagnosis not present

## 2017-11-19 DIAGNOSIS — F4323 Adjustment disorder with mixed anxiety and depressed mood: Secondary | ICD-10-CM | POA: Diagnosis not present

## 2017-11-23 ENCOUNTER — Other Ambulatory Visit: Payer: Self-pay | Admitting: Family Medicine

## 2017-11-25 DIAGNOSIS — F4323 Adjustment disorder with mixed anxiety and depressed mood: Secondary | ICD-10-CM | POA: Diagnosis not present

## 2017-11-30 DIAGNOSIS — F4323 Adjustment disorder with mixed anxiety and depressed mood: Secondary | ICD-10-CM | POA: Diagnosis not present

## 2017-12-17 DIAGNOSIS — F4323 Adjustment disorder with mixed anxiety and depressed mood: Secondary | ICD-10-CM | POA: Diagnosis not present

## 2017-12-21 DIAGNOSIS — F4323 Adjustment disorder with mixed anxiety and depressed mood: Secondary | ICD-10-CM | POA: Diagnosis not present

## 2018-01-06 ENCOUNTER — Ambulatory Visit: Payer: BLUE CROSS/BLUE SHIELD | Admitting: Family Medicine

## 2018-01-06 ENCOUNTER — Encounter: Payer: Self-pay | Admitting: Family Medicine

## 2018-01-06 VITALS — BP 130/70 | HR 84 | Temp 98.2°F | Resp 15 | Ht 66.0 in | Wt 178.4 lb

## 2018-01-06 DIAGNOSIS — Z23 Encounter for immunization: Secondary | ICD-10-CM

## 2018-01-06 DIAGNOSIS — Z113 Encounter for screening for infections with a predominantly sexual mode of transmission: Secondary | ICD-10-CM | POA: Diagnosis not present

## 2018-01-06 DIAGNOSIS — R300 Dysuria: Secondary | ICD-10-CM | POA: Diagnosis not present

## 2018-01-06 NOTE — Progress Notes (Signed)
Patient ID: Miki KinsJason Bevacqua, male    DOB: 1975/12/30, 42 y.o.   MRN: [id number]  PCP: Donita BrooksPickard, Warren T, MD  Chief Complaint  Patient presents with  . Exposure to STD    Patient in today for STD check. Would also like to get a flu shot    Subjective:   Miki KinsJason Brunke is a 42 y.o. male, presents to clinic with CC of STD check and wants to get flu shot.  He states he has no urinary sx, no penile discharge, no genital rash, lesions, swelling, pain.  He would like to get tested for all STD's.  He has had two male sexual partners in the past year.  He recently separated from and is in the process of getting back together with his wife, and they both agreed to get tested.   Patient Active Problem List   Diagnosis Date Noted  . HTN (hypertension) 07/13/2012  . Anxiety   . Depression   . Asthma   . Eczema      Prior to Admission medications   Medication Sig Start Date End Date Taking? Authorizing Provider  albuterol (PROVENTIL HFA;VENTOLIN HFA) 108 (90 Base) MCG/ACT inhaler Inhale 2 puffs into the lungs every 6 (six) hours as needed for wheezing. 06/24/15   Donita BrooksPickard, Warren T, MD  budesonide-formoterol (SYMBICORT) 80-4.5 MCG/ACT inhaler INHALE 2 PUFFS INTO THE LUNGS 2 (TWO) TIMES DAILY. 03/31/17   Donita BrooksPickard, Warren T, MD  fluticasone Aleda Grana(FLONASE) 50 MCG/ACT nasal spray Reported on 06/24/2015 10/23/13   [provider]  ketoconazole (NIZORAL) 2 % shampoo Apply 1 application topically 2 (two) times a week. 07/05/17   Danelle Berryapia, Ottis Sarnowski, PA-C  sertraline (ZOLOFT) 50 MG tablet TAKE 1 TABLET BY MOUTH EVERY DAY 11/23/17   Donita BrooksPickard, Warren T, MD  SYMBICORT 80-4.5 MCG/ACT inhaler INHALE 2 PUFFS INTO THE LUNGS TWICE DAILY 08/16/17   Donita BrooksPickard, Warren T, MD  SYMBICORT 80-4.5 MCG/ACT inhaler INHALE 2 PUFFS INTO THE LUNGS TWICE DAILY 08/30/17   Donita BrooksPickard, Warren T, MD  triamcinolone cream (KENALOG) 0.1 % APPLY TO AFFECTED AREAS TWICE DAILY 07/02/17   Danelle Berryapia, Raeden Belzer, PA-C     No Known Allergies   History  reviewed. No pertinent family history.   Social History   Socioeconomic History  . Marital status: Married    Spouse name: Not on file  . Number of children: Not on file  . Years of education: Not on file  . Highest education level: Not on file  Occupational History  . Not on file  Social Needs  . Financial resource strain: Not on file  . Food insecurity:    Worry: Not on file    Inability: Not on file  . Transportation needs:    Medical: Not on file    Non-medical: Not on file  Tobacco Use  . Smoking status: Never Smoker  . Smokeless tobacco: Never Used  Substance and Sexual Activity  . Alcohol use: Yes    Comment: Occasional  . Drug use: No  . Sexual activity: Not on file  Lifestyle  . Physical activity:    Days per week: Not on file    Minutes per session: Not on file  . Stress: Not on file  Relationships  . Social connections:    Talks on phone: Not on file    Gets together: Not on file    Attends religious service: Not on file    Active member of club or organization: Not on file    Attends meetings  of clubs or organizations: Not on file    Relationship status: Not on file  . Intimate partner violence:    Fear of current or ex partner: Not on file    Emotionally abused: Not on file    Physically abused: Not on file    Forced sexual activity: Not on file  Other Topics Concern  . Not on file  Social History Narrative  . Not on file     Review of Systems  Constitutional: Negative.   HENT: Negative.   Eyes: Negative.   Respiratory: Negative.   Cardiovascular: Negative.   Gastrointestinal: Negative.   Endocrine: Negative.   Genitourinary: Negative.   Musculoskeletal: Negative.   Skin: Negative.   Allergic/Immunologic: Negative.   Neurological: Negative.   Hematological: Negative.   Psychiatric/Behavioral: Negative.   All other systems reviewed and are negative.      Objective:    Vitals:   01/06/18 1616  BP: 130/70  Pulse: 84  Temp: 98.2  F (36.8 C)  SpO2: 97%  Weight: 178 lb 6 oz (80.9 kg)  Height: 5\' 6"  (1.676 m)      Physical Exam Vitals signs and nursing note reviewed.  Constitutional:      General: He is not in acute distress.    Appearance: He is well-developed and normal weight. He is not ill-appearing or toxic-appearing.  HENT:     Head: Normocephalic and atraumatic.     Right Ear: External ear normal.     Left Ear: External ear normal.     Nose: Nose normal.     Mouth/Throat:     Mouth: Mucous membranes are moist.     Pharynx: Oropharynx is clear.  Eyes:     General: No scleral icterus.       Right eye: No discharge.        Left eye: No discharge.     Conjunctiva/sclera: Conjunctivae normal.     Pupils: Pupils are equal, round, and reactive to light.  Neck:     Musculoskeletal: Normal range of motion and neck supple.     Trachea: No tracheal deviation.  Cardiovascular:     Rate and Rhythm: Normal rate and regular rhythm.     Pulses: Normal pulses.     Heart sounds: Normal heart sounds. No murmur. No friction rub. No gallop.   Pulmonary:     Effort: Pulmonary effort is normal. No respiratory distress.     Breath sounds: No stridor. No wheezing, rhonchi or rales.  Abdominal:     General: Bowel sounds are normal. There is no distension.     Palpations: Abdomen is soft. There is no mass.     Tenderness: There is no abdominal tenderness. There is no right CVA tenderness, left CVA tenderness, guarding or rebound.  Musculoskeletal: Normal range of motion.  Skin:    General: Skin is warm and dry.     Findings: No rash.  Neurological:     Mental Status: He is alert.     Motor: No abnormal muscle tone.     Coordination: Coordination normal.  Psychiatric:        Behavior: Behavior normal.           Assessment & Plan:   42 y/o male presents with request for STD screening, asx, 2 male sexual partners in the past year.  Also wants flu shot.  He has no signs or sx of STD, unremarkable PE.   Testing done on urine and blood.  ICD-10-CM   1. Screen for STD (sexually transmitted disease) Z11.3 C. trachomatis/N. gonorrhoeae RNA    RPR    HIV Antibody (routine testing w rflx)    HSV(herpes smplx)abs-1+2(IgG+IgM)-bld    Hepatitis panel, acute    Trichomonas vaginalis RNA, Ql,Males    CANCELED: Urine Culture    CANCELED: Urinalysis, Routine w reflex microscopic  2. Need for immunization against influenza Z23 Flu Vaccine QUAD 6+ mos PF IM (Fluarix Quad PF)       Danelle BerryLeisa Ouida Abeyta, PA-C 01/06/18 4:37 PM

## 2018-01-06 NOTE — Patient Instructions (Addendum)
Sign up for mychart with the info at the bottom right corner or the last page and when I get results I can release them to you and you can access by logging into your chart.   [email or web address].gov/[email or web address]  Can go to the CDC for info about STDs, testing, treatment and prevalence.

## 2018-01-07 LAB — HEPATITIS PANEL, ACUTE
HEP B C IGM: NONREACTIVE
Hep A IgM: NONREACTIVE
Hepatitis B Surface Ag: NONREACTIVE
Hepatitis C Ab: NONREACTIVE
SIGNAL TO CUT-OFF: 0.02 (ref <1.00)

## 2018-01-07 LAB — HIV ANTIBODY (ROUTINE TESTING W REFLEX): HIV: NONREACTIVE

## 2018-01-07 LAB — RPR: RPR Ser Ql: NONREACTIVE

## 2018-01-07 LAB — C. TRACHOMATIS/N. GONORRHOEAE RNA
C. TRACHOMATIS RNA, TMA: NOT DETECTED
N. gonorrhoeae RNA, TMA: NOT DETECTED

## 2018-01-07 LAB — TRICHOMONAS VAGINALIS RNA, QL,MALES: Trichomonas vaginalis RNA: NOT DETECTED

## 2018-01-24 DIAGNOSIS — F4323 Adjustment disorder with mixed anxiety and depressed mood: Secondary | ICD-10-CM | POA: Diagnosis not present

## 2018-02-10 DIAGNOSIS — F4323 Adjustment disorder with mixed anxiety and depressed mood: Secondary | ICD-10-CM | POA: Diagnosis not present

## 2018-03-25 DIAGNOSIS — S01112A Laceration without foreign body of left eyelid and periocular area, initial encounter: Secondary | ICD-10-CM | POA: Diagnosis not present

## 2018-04-25 ENCOUNTER — Telehealth: Payer: Self-pay | Admitting: Physician Assistant

## 2018-04-25 ENCOUNTER — Other Ambulatory Visit: Payer: Self-pay | Admitting: Family Medicine

## 2018-04-25 ENCOUNTER — Encounter: Payer: Self-pay | Admitting: Physician Assistant

## 2018-04-25 ENCOUNTER — Telehealth: Payer: Self-pay

## 2018-04-25 DIAGNOSIS — L309 Dermatitis, unspecified: Secondary | ICD-10-CM

## 2018-04-25 MED ORDER — TRIAMCINOLONE ACETONIDE 0.1 % EX CREA: 1.0000 "application " | TOPICAL_CREAM | Freq: Two times a day (BID) | CUTANEOUS | 0 refills | Status: DC

## 2018-04-25 MED ORDER — KETOCONAZOLE 2 % EX SHAM: 1.0000 "application " | MEDICATED_SHAMPOO | CUTANEOUS | 0 refills | Status: DC

## 2018-04-25 MED ORDER — TRIAMCINOLONE ACETONIDE 0.1 % EX CREA: TOPICAL_CREAM | CUTANEOUS | 0 refills | Status: DC

## 2018-04-25 NOTE — Progress Notes (Signed)
Based on what you shared with me, I feel your condition warrants further evaluation and I recommend that you be seen for a face to face office visit.  Ms .Whisenhunt Please complete another Evisit under Rash. Thanks     NOTE: If you entered your credit card information for this eVisit, you will not be charged. You may see a "hold" on your card for the $35 but that hold will drop off and you will not have a charge processed.  If you are having a true medical emergency please call 911.  If you need an urgent face to face visit, [place] has four urgent care centers for your convenience.    PLEASE NOTE: THE [facility]STACARE LOCATIONS AND URGENT CARE CLINICS DO NOT HAVE THE TESTING FOR CORONAVIRUS COVID19 AVAILABLE.  IF YOU FEEL YOU NEED THIS TEST YOU MUST GO TO A TRIAGE LOCATION AT ONE OF THE HOSPITAL EMERGENCY DEPARTMENTS   [email or web address] to reserve your spot online an avoid wait times  [facility] [address] [place], [place] [address] Modified hours of operation: Monday-Friday, 10 AM to 6 PM  Saturday & Sunday 10 AM to 4 PM *Across the street from [employer]  [employer] (New Address!) [address] [place], [place] [address] *Just off [employer], across the road from [place]* Modified hours of operation: Monday-Friday, 10 AM to 5 PM  Closed Saturday & Sunday   The following sites will take your insurance:  . [facility]  [phone] Get Driving Directions Find a Provider at this Location  [address] [place], [place] [address] . 10 am to 8 pm Monday-Friday . 12 pm to 8 pm Saturday-Sunday   . [facility]  [phone] Get Driving Directions Find a Provider at this Location  [address] [place], [place] [address] . 8 am to 8 pm Monday-Friday . 9 am to 6 pm Saturday . 11 am to 6 pm Sunday   . [facility] at  [facility]  [phone] Get Driving Directions  [address] [place], [place] [address] . 8 am to 8 pm Monday-Friday . 8 am to 4 pm Saturday-Sunday   Your e-visit answers were reviewed by a board certified advanced clinical practitioner to complete your personal care plan.  Thank you for using e-Visits.  I have spent 7 min in completion and review of this note- Illa Level [facility]

## 2018-04-25 NOTE — Progress Notes (Signed)
E Visit for Rash  We are sorry that you are not feeling well. Here is how we plan to help!    Ms. Mahony, based on what you shared with me, it appears that you have a flare of your seasonal eczema. I have prescribed Trimcinolone Cream 1%. Apply thin layer to the skin twice daily. Use sparingly, over use of steroid creams can cause damage to the skin such as thinning of the skin and appearance of the underlying blood vessels.   HOME CARE:   Take cool showers and avoid direct sunlight.  Apply cool compress or wet dressings.  Take a bath in an oatmeal bath.  Sprinkle content of one Aveeno packet under running faucet with comfortably warm water.  Bathe for 15-20 minutes, 1-2 times daily.  Pat dry with a towel. Do not rub the rash.  Use hydrocortisone cream.  Take an antihistamine like Benadryl for widespread rashes that itch.  The adult dose of Benadryl is 25-50 mg by mouth 4 times daily.  Caution:  This type of medication may cause sleepiness.  Do not drink alcohol, drive, or operate dangerous machinery while taking antihistamines.  Do not take these medications if you have prostate enlargement.  Read package instructions thoroughly on all medications that you take.  GET HELP RIGHT AWAY IF:   Symptoms don't go away after treatment.  Severe itching that persists.  If you rash spreads or swells.  If you rash begins to smell.  If it blisters and opens or develops a yellow-brown crust.  You develop a fever.  You have a sore throat.  You become short of breath.  MAKE SURE YOU:  Understand these instructions. Will watch your condition. Will get help right away if you are not doing well or get worse.  Thank you for choosing an e-visit. Your e-visit answers were reviewed by a board certified advanced clinical practitioner to complete your personal care plan. Depending upon the condition, your plan could have included both over the counter or prescription medications. Please  review your pharmacy choice. Be sure that the pharmacy you have chosen is open so that you can pick up your prescription now.  If there is a problem you may message your provider in MyChart to have the prescription routed to another pharmacy. Your safety is important to [place]. If you have drug allergies check your prescription carefully.  For the next 24 hours, you can use MyChart to ask questions about today's visit, request a non-urgent call back, or ask for a work or school excuse from your e-visit provider. You will get an email in the next two days asking about your experience. I hope that your e-visit has been valuable and will speed your recovery.     I have spent 7 min in completion and review of this note- Illa Level [facility]

## 2018-04-25 NOTE — Telephone Encounter (Signed)
PA submitted via CMM forTriamcinolone Acetonide 0.1% cream. Waiting for reply.

## 2018-04-27 ENCOUNTER — Other Ambulatory Visit: Payer: Self-pay | Admitting: *Deleted

## 2018-04-27 MED ORDER — BUDESONIDE-FORMOTEROL FUMARATE 80-4.5 MCG/ACT IN AERO: 2.0000 | INHALATION_SPRAY | Freq: Two times a day (BID) | RESPIRATORY_TRACT | 1 refills | Status: AC

## 2018-06-23 DIAGNOSIS — L820 Inflamed seborrheic keratosis: Secondary | ICD-10-CM | POA: Diagnosis not present

## 2018-06-23 DIAGNOSIS — L219 Seborrheic dermatitis, unspecified: Secondary | ICD-10-CM | POA: Diagnosis not present

## 2018-06-23 DIAGNOSIS — L309 Dermatitis, unspecified: Secondary | ICD-10-CM | POA: Diagnosis not present

## 2018-06-23 DIAGNOSIS — D485 Neoplasm of uncertain behavior of skin: Secondary | ICD-10-CM | POA: Diagnosis not present

## 2018-09-14 ENCOUNTER — Other Ambulatory Visit: Payer: Self-pay | Admitting: Family Medicine

## 2018-09-22 ENCOUNTER — Other Ambulatory Visit: Payer: Self-pay

## 2018-09-22 DIAGNOSIS — Z20822 Contact with and (suspected) exposure to covid-19: Secondary | ICD-10-CM

## 2018-09-22 DIAGNOSIS — R6889 Other general symptoms and signs: Secondary | ICD-10-CM | POA: Diagnosis not present

## 2018-09-23 LAB — NOVEL CORONAVIRUS, NAA: SARS-CoV-2, NAA: NOT DETECTED

## 2018-10-24 ENCOUNTER — Ambulatory Visit (INDEPENDENT_AMBULATORY_CARE_PROVIDER_SITE_OTHER): Payer: BC Managed Care – PPO

## 2018-10-24 ENCOUNTER — Other Ambulatory Visit: Payer: Self-pay

## 2018-10-24 DIAGNOSIS — Z23 Encounter for immunization: Secondary | ICD-10-CM | POA: Diagnosis not present

## 2018-10-30 DIAGNOSIS — Z20828 Contact with and (suspected) exposure to other viral communicable diseases: Secondary | ICD-10-CM | POA: Diagnosis not present

## 2018-12-10 ENCOUNTER — Other Ambulatory Visit: Payer: Self-pay | Admitting: Family Medicine

## 2019-02-21 ENCOUNTER — Other Ambulatory Visit: Payer: Self-pay | Admitting: Family Medicine

## 2019-06-29 ENCOUNTER — Other Ambulatory Visit: Payer: Self-pay

## 2019-06-29 ENCOUNTER — Ambulatory Visit
Admission: RE | Admit: 2019-06-29 | Discharge: 2019-06-29 | Disposition: A | Discharge status: Home / Self Care | Payer: BC Managed Care – PPO | Source: Ambulatory Visit | Attending: Family Medicine | Admitting: Family Medicine

## 2019-06-29 ENCOUNTER — Ambulatory Visit (INDEPENDENT_AMBULATORY_CARE_PROVIDER_SITE_OTHER): Payer: BC Managed Care – PPO | Admitting: Family Medicine

## 2019-06-29 VITALS — BP 140/100 | HR 94 | Temp 97.6°F | Wt 184.0 lb

## 2019-06-29 DIAGNOSIS — M545 Low back pain: Secondary | ICD-10-CM

## 2019-06-29 DIAGNOSIS — I10 Essential (primary) hypertension: Secondary | ICD-10-CM | POA: Diagnosis not present

## 2019-06-29 DIAGNOSIS — J4530 Mild persistent asthma, uncomplicated: Secondary | ICD-10-CM

## 2019-06-29 DIAGNOSIS — G8929 Other chronic pain: Secondary | ICD-10-CM

## 2019-06-29 MED ORDER — ALBUTEROL SULFATE HFA 108 (90 BASE) MCG/ACT IN AERS: 2.0000 | INHALATION_SPRAY | Freq: Four times a day (QID) | RESPIRATORY_TRACT | 1 refills | Status: DC | PRN

## 2019-06-29 MED ORDER — TRIAMCINOLONE ACETONIDE 0.1 % EX CREA: TOPICAL_CREAM | Freq: Two times a day (BID) | CUTANEOUS | 0 refills | Status: DC

## 2019-06-29 MED ORDER — LOSARTAN POTASSIUM 50 MG PO TABS: 50.0000 mg | ORAL_TABLET | Freq: Every day | ORAL | 3 refills | Status: DC

## 2019-06-29 NOTE — Progress Notes (Signed)
Subjective:    Patient ID: Andres Jennings, male    DOB: 1975-07-21, 44 y.o.   MRN: [id number]  HPI  I have not seen the patient since 2017.  Presents today complaining of a 76-month history of low back pain.  The pain is located around the level of L5.  Is in a bandlike pattern posteriorly.  He denies any radiation down his legs.  He denies any saddle anesthesia.  He denies any bowel or bladder incontinence.  He denies any dysuria or hematuria or urgency or frequency.  Pain comes and goes but tends to be more of a deep aching-like pain consistent with a muscle.  His blood pressure is also elevated 140/100.  He to is checked his blood pressure at home and finds it to be in the 140s over 80s.  Family history significant for a father who had CABG in his 23s.  Patient has a history of asthma.  He is using the Symbicort sporadically.  He never needs a rescue inhaler.  He questions if he still needs the Symbicort.Marland Kitchen He also has a history of severe eczema. He uses triamcinolone cream 1 or 2 times per week.  Past Medical History:  Diagnosis Date  . Anxiety   . Asthma   . Depression   . Eczema    No past surgical history on file. Current Outpatient Medications on File Prior to Visit  Medication Sig Dispense Refill  . budesonide-formoterol (SYMBICORT) 80-4.5 MCG/ACT inhaler Inhale 2 puffs into the lungs 2 (two) times daily. 20.4 Inhaler 1  . budesonide-formoterol (SYMBICORT) 80-4.5 MCG/ACT inhaler     . fluticasone (FLONASE) 50 MCG/ACT nasal spray Reported on 06/24/2015  5  . ketoconazole (NIZORAL) 2 % shampoo Apply 1 application topically 2 (two) times a week. 120 mL 0  . sertraline (ZOLOFT) 50 MG tablet TAKE 1 TABLET BY MOUTH EVERY DAY 90 tablet 3  . triamcinolone cream (KENALOG) 0.1 % Apply 1 application topically 2 (two) times daily. 30 g 0   No current facility-administered medications on file prior to visit.   No Known Allergies Social History   Socioeconomic History  . Marital status:  Married    Spouse name: Not on file  . Number of children: Not on file  . Years of education: Not on file  . Highest education level: Not on file  Occupational History  . Not on file  Tobacco Use  . Smoking status: Never Smoker  . Smokeless tobacco: Never Used  Substance and Sexual Activity  . Alcohol use: Yes    Comment: Occasional  . Drug use: No  . Sexual activity: Not on file  Other Topics Concern  . Not on file  Social History Narrative  . Not on file   Social Determinants of Health   Financial Resource Strain:   . Difficulty of Paying Living Expenses:   Food Insecurity:   . Worried About Charity fundraiser in the Last Year:   . Arboriculturist in the Last Year:   Transportation Needs:   . Film/video editor (Medical):   Marland Kitchen Lack of Transportation (Non-Medical):   Physical Activity:   . Days of Exercise per Week:   . Minutes of Exercise per Session:   Stress:   . Feeling of Stress :   Social Connections:   . Frequency of Communication with Friends and Family:   . Frequency of Social Gatherings with Friends and Family:   . Attends Religious Services:   .  Active Member of Clubs or Organizations:   . Attends Banker Meetings:   Marland Kitchen Marital Status:   Intimate Partner Violence:   . Fear of Current or Ex-Partner:   . Emotionally Abused:   Marland Kitchen Physically Abused:   . Sexually Abused:      Review of Systems  All other systems reviewed and are negative.      Objective:   Physical Exam Vitals reviewed.  Cardiovascular:     Rate and Rhythm: Normal rate and regular rhythm.     Heart sounds: Normal heart sounds. No murmur heard.  No friction rub. No gallop.   Pulmonary:     Effort: Pulmonary effort is normal. No respiratory distress.     Breath sounds: Normal breath sounds. No wheezing or rales.  Abdominal:     General: Bowel sounds are normal. There is no distension.     Palpations: Abdomen is soft.     Tenderness: There is no abdominal  tenderness. There is no guarding or rebound.  Musculoskeletal:     Lumbar back: No swelling, deformity, signs of trauma, spasms, tenderness or bony tenderness. Normal range of motion. Negative right straight leg raise test and negative left straight leg raise test.  Neurological:     Gait: Gait normal.     Deep Tendon Reflexes: Reflexes normal.  Psychiatric:        Behavior: Behavior normal.        Thought Content: Thought content normal.        Judgment: Judgment normal.           Assessment & Plan:  Chronic midline low back pain without sciatica - Plan: DG Lumbar Spine Complete  Mild persistent asthma without complication - Plan: albuterol (VENTOLIN HFA) 108 (90 Base) MCG/ACT inhaler  Essential hypertension - Plan: CBC with Differential/Platelet, COMPLETE METABOLIC PANEL WITH GFR, Lipid panel  Blood pressure is elevated.  Begin losartan 50 mg a day.  Check fasting blood work Advertising account executive.  This will include a CBC, CMP, fasting lipid panel.  Check a lumbar spine x-ray however I suspect that this is most likely musculoskeletal in a chronic muscle strain.  Would recommend physical therapy if the x-ray is normal.  Believe the patient can discontinue Symbicort and use albuterol only for his attacks.  As long as he is using his rescue inhaler less than 2 days a week or 2 nights a month I do not feel that he requires preventative therapy.  Patient is comfortable with this plan.

## 2019-06-30 ENCOUNTER — Other Ambulatory Visit: Payer: Self-pay | Admitting: Family Medicine

## 2019-06-30 DIAGNOSIS — M545 Low back pain, unspecified: Secondary | ICD-10-CM

## 2019-06-30 DIAGNOSIS — G8929 Other chronic pain: Secondary | ICD-10-CM

## 2019-07-19 ENCOUNTER — Ambulatory Visit: Payer: BC Managed Care – PPO | Attending: Family Medicine

## 2019-09-28 DIAGNOSIS — J3081 Allergic rhinitis due to animal (cat) (dog) hair and dander: Secondary | ICD-10-CM | POA: Diagnosis not present

## 2019-09-28 DIAGNOSIS — J3089 Other allergic rhinitis: Secondary | ICD-10-CM | POA: Diagnosis not present

## 2019-09-28 DIAGNOSIS — J301 Allergic rhinitis due to pollen: Secondary | ICD-10-CM | POA: Diagnosis not present

## 2019-09-28 DIAGNOSIS — J3489 Other specified disorders of nose and nasal sinuses: Secondary | ICD-10-CM | POA: Diagnosis not present

## 2019-12-01 DIAGNOSIS — J3081 Allergic rhinitis due to animal (cat) (dog) hair and dander: Secondary | ICD-10-CM | POA: Diagnosis not present

## 2019-12-01 DIAGNOSIS — J301 Allergic rhinitis due to pollen: Secondary | ICD-10-CM | POA: Diagnosis not present

## 2019-12-01 DIAGNOSIS — J3089 Other allergic rhinitis: Secondary | ICD-10-CM | POA: Diagnosis not present

## 2019-12-01 DIAGNOSIS — J3489 Other specified disorders of nose and nasal sinuses: Secondary | ICD-10-CM | POA: Diagnosis not present

## 2019-12-08 ENCOUNTER — Other Ambulatory Visit: Payer: Self-pay | Admitting: Family Medicine

## 2019-12-12 NOTE — Telephone Encounter (Signed)
Pt called for rx refill Last refill 12-12-2018

## 2020-01-03 DIAGNOSIS — L209 Atopic dermatitis, unspecified: Secondary | ICD-10-CM | POA: Diagnosis not present

## 2020-01-03 DIAGNOSIS — L2089 Other atopic dermatitis: Secondary | ICD-10-CM | POA: Diagnosis not present

## 2020-03-01 DIAGNOSIS — J3089 Other allergic rhinitis: Secondary | ICD-10-CM | POA: Diagnosis not present

## 2020-03-01 DIAGNOSIS — J3489 Other specified disorders of nose and nasal sinuses: Secondary | ICD-10-CM | POA: Diagnosis not present

## 2020-03-01 DIAGNOSIS — J3081 Allergic rhinitis due to animal (cat) (dog) hair and dander: Secondary | ICD-10-CM | POA: Diagnosis not present

## 2020-03-01 DIAGNOSIS — J301 Allergic rhinitis due to pollen: Secondary | ICD-10-CM | POA: Diagnosis not present

## 2020-06-25 ENCOUNTER — Other Ambulatory Visit: Payer: Self-pay | Admitting: Family Medicine

## 2020-06-25 DIAGNOSIS — I10 Essential (primary) hypertension: Secondary | ICD-10-CM

## 2020-07-18 ENCOUNTER — Other Ambulatory Visit: Payer: Self-pay | Admitting: Family Medicine

## 2020-07-18 DIAGNOSIS — I10 Essential (primary) hypertension: Secondary | ICD-10-CM

## 2020-08-18 ENCOUNTER — Other Ambulatory Visit: Payer: Self-pay | Admitting: Family Medicine

## 2020-08-18 DIAGNOSIS — I10 Essential (primary) hypertension: Secondary | ICD-10-CM

## 2020-09-10 ENCOUNTER — Ambulatory Visit (INDEPENDENT_AMBULATORY_CARE_PROVIDER_SITE_OTHER): Payer: BC Managed Care – PPO | Admitting: Family Medicine

## 2020-09-10 ENCOUNTER — Other Ambulatory Visit: Payer: Self-pay

## 2020-09-10 VITALS — BP 132/80 | HR 72 | Temp 98.9°F | Resp 14 | Ht 66.0 in | Wt 187.0 lb

## 2020-09-10 DIAGNOSIS — K649 Unspecified hemorrhoids: Secondary | ICD-10-CM

## 2020-09-10 MED ORDER — HYDROCORTISONE (PERIANAL) 2.5 % EX CREA: 1.0000 "application " | TOPICAL_CREAM | Freq: Two times a day (BID) | CUTANEOUS | 0 refills | Status: DC

## 2020-09-10 NOTE — Progress Notes (Signed)
Subjective:    Patient ID: Andres Jennings, male    DOB: 1975/08/07, 45 y.o.   MRN: [id number]  HPI  Patient states that he has a thrombosed hemorrhoid on the right side of his rectum.  However he does not want me to examine him.  He declines to allow me to look at it.  He describes it as a swollen purple hemorrhoid.  He states that he seen pictures and he knows what 1 looks like.  He believes that there is a dark spot in the center that would represent the thrombosis.  I explained to him that I cannot be sure without examining him but it certainly sounds like it could be a thrombosed hemorrhoid.  He states that it does not hurt when he sitting.  However when he is walking and it is rubbing against the adjacent tissues it causes him discomfort and pain.  He has been using Preparation H with minimal relief.  Again he declines to allow me to examine him. Past Medical History:  Diagnosis Date   Anxiety    Asthma    Depression    Eczema    No past surgical history on file. Current Outpatient Medications on File Prior to Visit  Medication Sig Dispense Refill   albuterol (VENTOLIN HFA) 108 (90 Base) MCG/ACT inhaler Inhale 2 puffs into the lungs every 6 (six) hours as needed for wheezing. 18 g 1   budesonide-formoterol (SYMBICORT) 80-4.5 MCG/ACT inhaler Inhale 2 puffs into the lungs 2 (two) times daily. 20.4 Inhaler 1   budesonide-formoterol (SYMBICORT) 80-4.5 MCG/ACT inhaler      fluticasone (FLONASE) 50 MCG/ACT nasal spray Reported on 06/24/2015  5   ketoconazole (NIZORAL) 2 % shampoo Apply 1 application topically 2 (two) times a week. 120 mL 0   losartan (COZAAR) 50 MG tablet TAKE 1 TABLET BY MOUTH EVERY DAY 30 tablet 1   sertraline (ZOLOFT) 50 MG tablet TAKE 1 TABLET BY MOUTH EVERY DAY 90 tablet 3   triamcinolone cream (KENALOG) 0.1 % Apply 1 application topically 2 (two) times daily. 30 g 0   triamcinolone cream (KENALOG) 0.1 % Apply topically 2 (two) times daily. 453.6 g 0   No current  facility-administered medications on file prior to visit.   No Known Allergies Social History   Socioeconomic History   Marital status: Married    Spouse name: Not on file   Number of children: Not on file   Years of education: Not on file   Highest education level: Not on file  Occupational History   Not on file  Tobacco Use   Smoking status: Never   Smokeless tobacco: Never  Substance and Sexual Activity   Alcohol use: Yes    Comment: Occasional   Drug use: No   Sexual activity: Not on file  Other Topics Concern   Not on file  Social History Narrative   Not on file   Social Determinants of Health   Financial Resource Strain: Not on file  Food Insecurity: Not on file  Transportation Needs: Not on file  Physical Activity: Not on file  Stress: Not on file  Social Connections: Not on file  Intimate Partner Violence: Not on file     Review of Systems  All other systems reviewed and are negative.     Objective:   Physical Exam Vitals reviewed.  Constitutional:      General: He is not in acute distress.    Appearance: Normal appearance. He is not ill-appearing,  toxic-appearing or diaphoretic.  Cardiovascular:     Rate and Rhythm: Normal rate and regular rhythm.  Pulmonary:     Effort: Pulmonary effort is normal.     Breath sounds: Normal breath sounds.  Neurological:     Mental Status: He is alert.          Assessment & Plan:  Hemorrhoids, unspecified hemorrhoid type Difficult to ascertain without an exam.  However the history certainly sounds consistent with an external hemorrhoid.  I cannot verify that it is thrombosed without an exam.  I explained to him that if it is thrombosed I could perform an incision and drainage and remove the clot which may help with the pain.  He states that the pain is not that bad and he prefers not to do that.  He would like to try the 2.5% hydrocortisone cream apply twice daily to the affected area.  He can use topical  lidocaine for comfort as needed.  Reassess if not improving at that point I would have to perform an exam to determine treatment options.  Patient is comfortable with that plan

## 2020-09-17 ENCOUNTER — Other Ambulatory Visit: Payer: Self-pay | Admitting: Family Medicine

## 2020-09-17 DIAGNOSIS — I10 Essential (primary) hypertension: Secondary | ICD-10-CM

## 2020-10-12 ENCOUNTER — Other Ambulatory Visit: Payer: Self-pay | Admitting: Family Medicine

## 2020-10-12 DIAGNOSIS — I10 Essential (primary) hypertension: Secondary | ICD-10-CM

## 2020-11-07 ENCOUNTER — Other Ambulatory Visit: Payer: Self-pay | Admitting: Family Medicine

## 2020-11-07 DIAGNOSIS — I10 Essential (primary) hypertension: Secondary | ICD-10-CM

## 2020-11-20 ENCOUNTER — Telehealth: Payer: Self-pay | Admitting: *Deleted

## 2020-11-20 DIAGNOSIS — J4530 Mild persistent asthma, uncomplicated: Secondary | ICD-10-CM

## 2020-11-20 NOTE — Telephone Encounter (Signed)
Received call from patient.   Reports that he has positive exposure x2 to strep from his children. Denies Sx at this time.   States that [place] is going [place] of the [place]. Inquired if PCP could prescribe ABTx for him to take with him in case Sx begin.   Please advise.

## 2020-11-21 ENCOUNTER — Other Ambulatory Visit: Payer: Self-pay | Admitting: Family Medicine

## 2020-11-21 MED ORDER — AMOXICILLIN 875 MG PO TABS: 875.0000 mg | ORAL_TABLET | Freq: Two times a day (BID) | ORAL | 0 refills | Status: AC

## 2020-11-21 MED ORDER — AMOXICILLIN 875 MG PO TABS: 875.0000 mg | ORAL_TABLET | Freq: Two times a day (BID) | ORAL | 0 refills | Status: DC

## 2020-11-21 NOTE — Telephone Encounter (Signed)
Call placed to patient and patient made aware. Verbalized understanding.  

## 2020-12-13 ENCOUNTER — Other Ambulatory Visit: Payer: Self-pay

## 2020-12-13 DIAGNOSIS — I10 Essential (primary) hypertension: Secondary | ICD-10-CM

## 2020-12-13 MED ORDER — LOSARTAN POTASSIUM 50 MG PO TABS: 50.0000 mg | ORAL_TABLET | Freq: Every day | ORAL | 1 refills | Status: DC

## 2021-01-21 ENCOUNTER — Other Ambulatory Visit: Payer: Self-pay

## 2021-01-21 DIAGNOSIS — I10 Essential (primary) hypertension: Secondary | ICD-10-CM

## 2021-01-21 MED ORDER — LOSARTAN POTASSIUM 50 MG PO TABS: 50.0000 mg | ORAL_TABLET | Freq: Every day | ORAL | 1 refills | Status: DC

## 2021-02-13 IMAGING — CR DG LUMBAR SPINE COMPLETE 4+V
5 series · 5 of 5 positions shown · non-contrast
Comparison: None.

CLINICAL DATA: Low back pain 6 months, no known injury

EXAM:
LUMBAR SPINE - COMPLETE 4+ VIEW

[t l-spine a.p.]
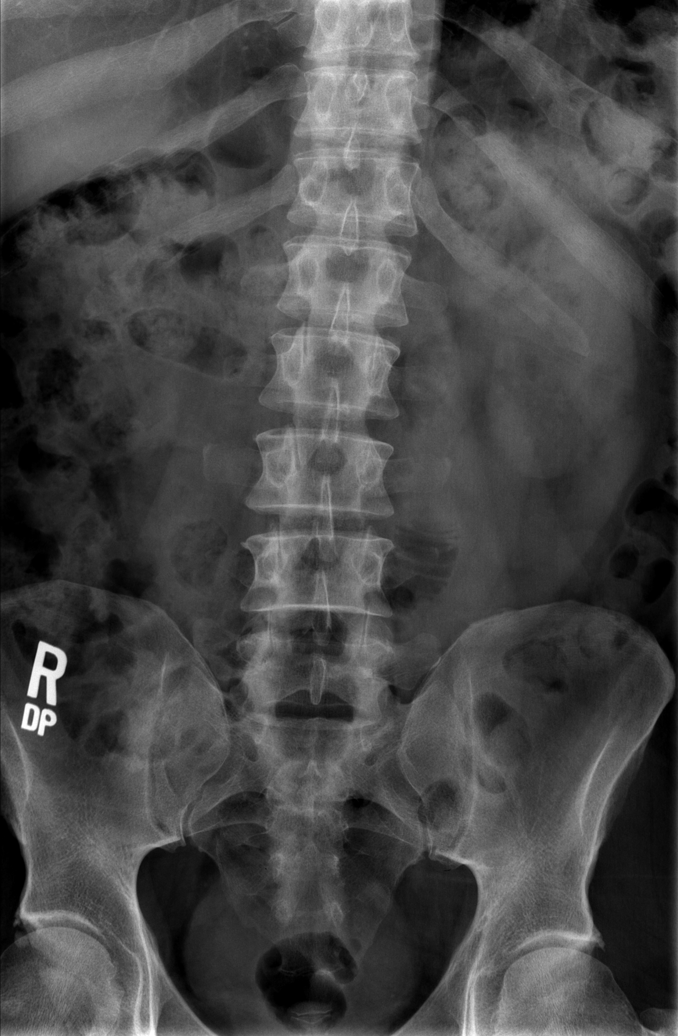

[t l-spine oblique exposure (1 of 2)]
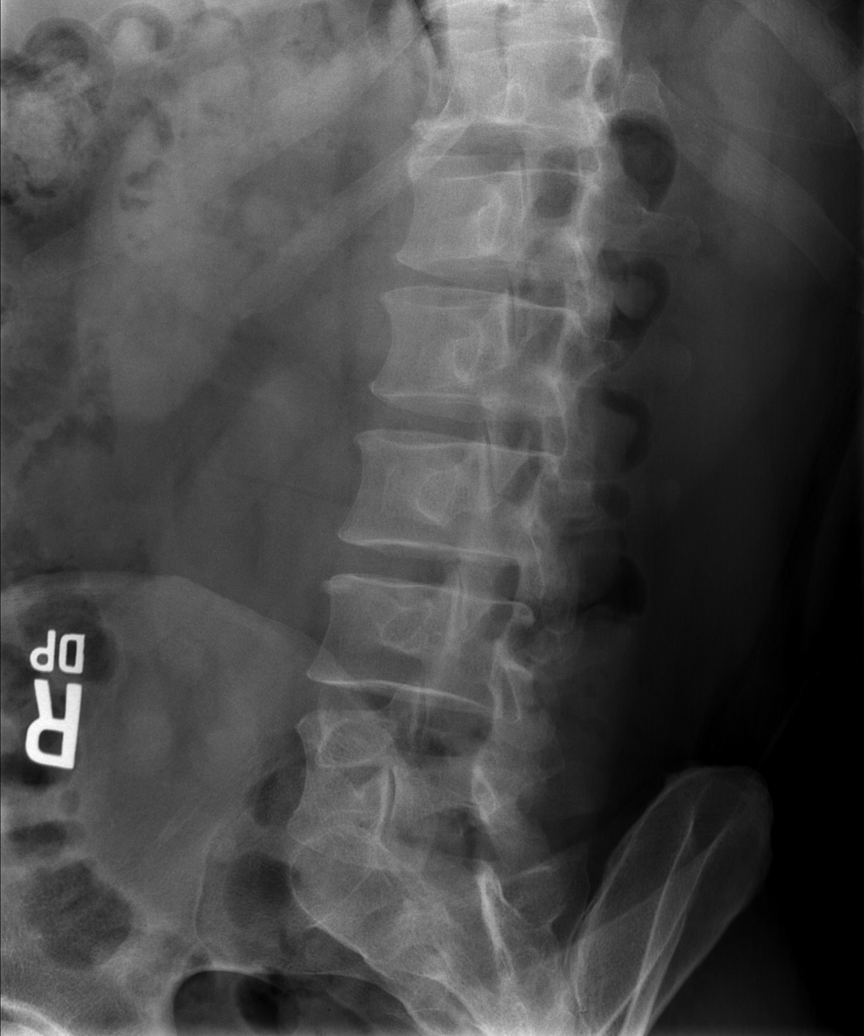

[t l-spine oblique exposure (2 of 2)]
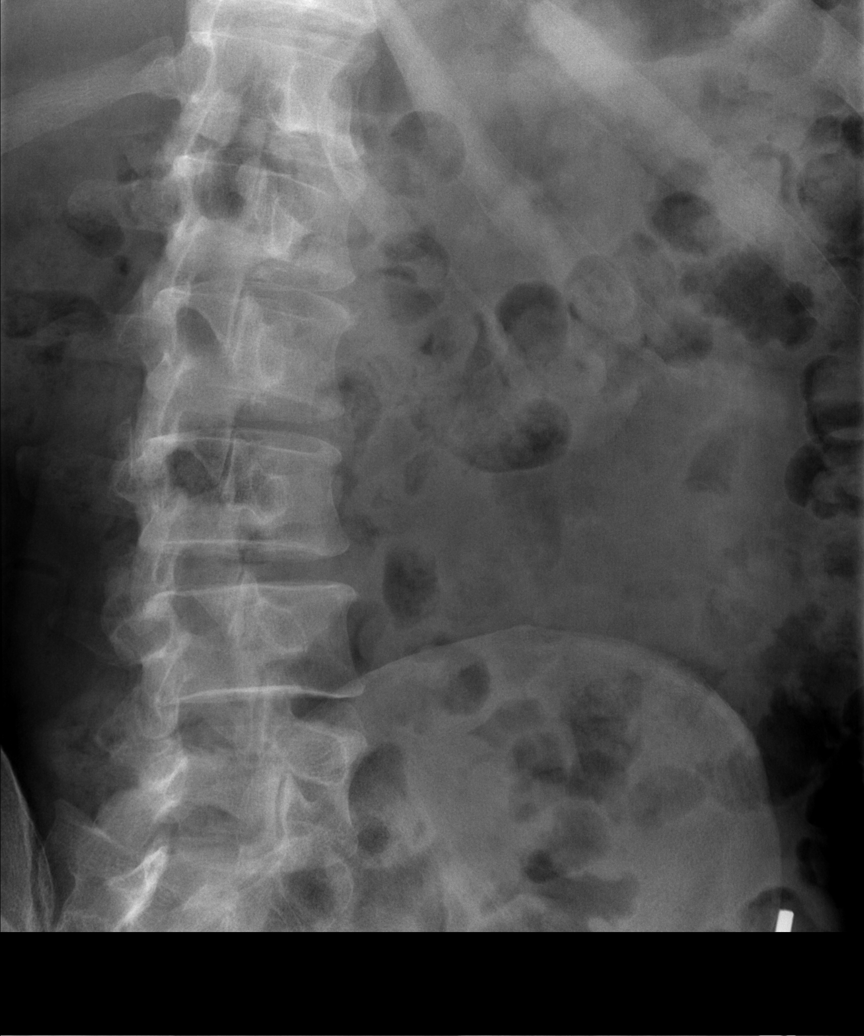

[t l-spine lat]
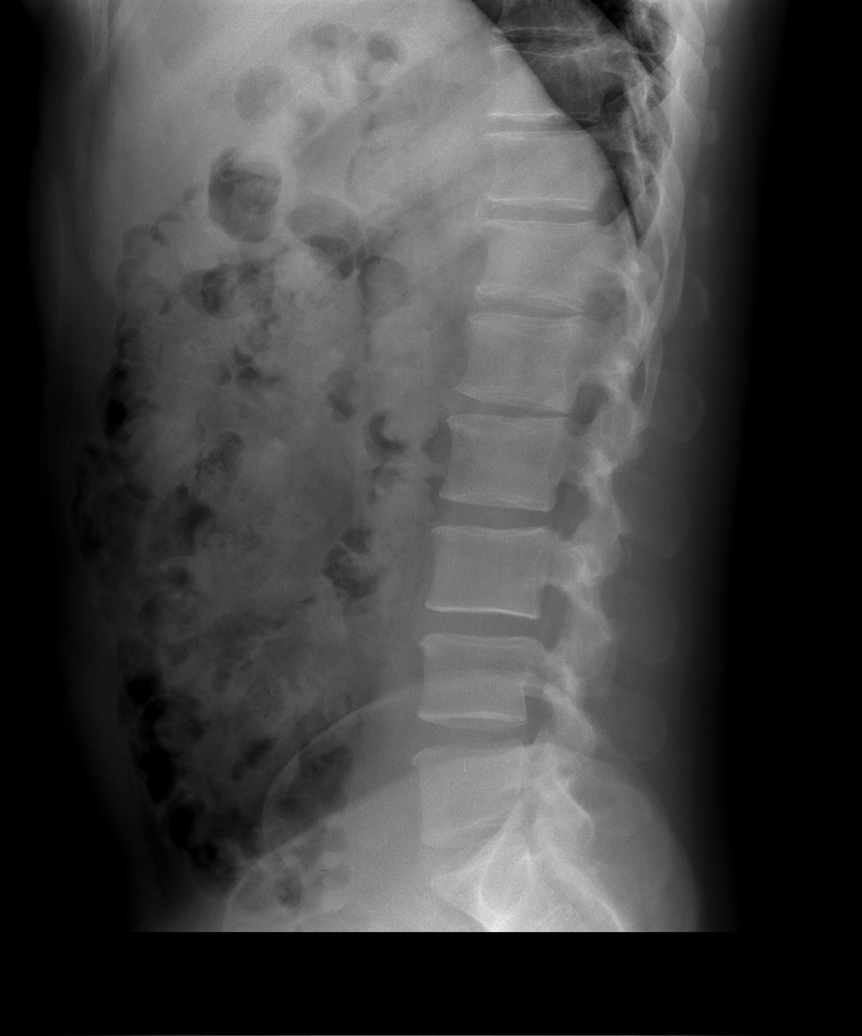

[t l-spine l5-s1 spot]
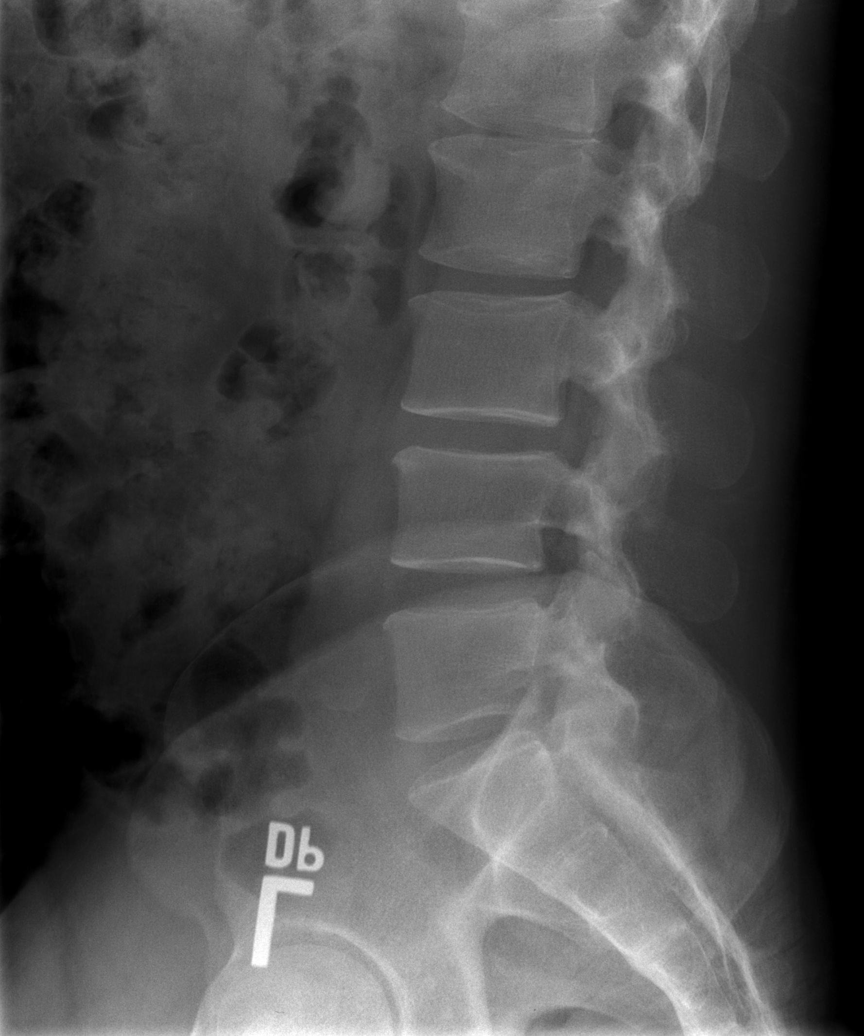

[5 of 5 positions shown; findings below may reference images not displayed]

FINDINGS: Five non-rib-bearing lumbar type vertebral bodies. Alignment is
normal. Vertebral body heights and intervertebral disc spaces are
maintained. No focal bone lesion. Minimal multilevel anterior
spurring. SI joints are open. Nonobstructive bowel gas pattern.
IMPRESSION: Negative lumbar spine radiographs.

## 2021-06-20 DIAGNOSIS — J3089 Other allergic rhinitis: Secondary | ICD-10-CM | POA: Diagnosis not present

## 2021-06-20 DIAGNOSIS — L2089 Other atopic dermatitis: Secondary | ICD-10-CM | POA: Diagnosis not present

## 2021-06-20 DIAGNOSIS — J3489 Other specified disorders of nose and nasal sinuses: Secondary | ICD-10-CM | POA: Diagnosis not present

## 2021-06-20 DIAGNOSIS — J301 Allergic rhinitis due to pollen: Secondary | ICD-10-CM | POA: Diagnosis not present

## 2021-06-20 DIAGNOSIS — J3081 Allergic rhinitis due to animal (cat) (dog) hair and dander: Secondary | ICD-10-CM | POA: Diagnosis not present

## 2021-06-20 DIAGNOSIS — J4530 Mild persistent asthma, uncomplicated: Secondary | ICD-10-CM | POA: Diagnosis not present

## 2021-07-25 ENCOUNTER — Other Ambulatory Visit: Payer: Self-pay | Admitting: Family Medicine

## 2021-07-25 DIAGNOSIS — I10 Essential (primary) hypertension: Secondary | ICD-10-CM

## 2021-07-25 NOTE — Telephone Encounter (Signed)
Call to patient- scheduled medication follow up. Courtesy RF #[id number] given Requested Prescriptions  Pending Prescriptions Disp Refills  . losartan (COZAAR) 50 MG tablet [Pharmacy Med Name: LOSARTAN POTASSIUM 50 MG TAB] 30 tablet 0    Sig: TAKE 1 TABLET BY MOUTH EVERY DAY     Cardiovascular:  Angiotensin Receptor Blockers Failed - 07/25/2021  2:35 AM      Failed - Cr in normal range and within 180 days    Creat  Date Value Ref Range Status  06/25/2015 1.01 0.60 - 1.35 mg/dL Final         Failed - K in normal range and within 180 days    Potassium  Date Value Ref Range Status  06/25/2015 4.8 3.5 - 5.3 mmol/L Final         Failed - Valid encounter within last 6 months    Recent Outpatient Visits          10 months ago Hemorrhoids, unspecified hemorrhoid type   [facility] Donita Brooks, MD   2 years ago Chronic midline low back pain without sciatica   [facility] Donita Brooks, MD   3 years ago Screen for STD (sexually transmitted disease)   [facility] Danelle Berry, PA-C   4 years ago Eczema, unspecified type   [facility] Danelle Berry, PA-C   6 years ago Asthma, mild persistent, uncomplicated   [facility] Pickard, Priscille Heidelberg, MD      Future Appointments            In 1 week Tanya Nones, Priscille Heidelberg, MD [facility]           Passed - Patient is not pregnant      Passed - Last BP in normal range    BP Readings from Last 1 Encounters:  09/10/20 132/80

## 2021-08-07 ENCOUNTER — Ambulatory Visit (INDEPENDENT_AMBULATORY_CARE_PROVIDER_SITE_OTHER): Payer: BC Managed Care – PPO | Admitting: Family Medicine

## 2021-08-07 VITALS — BP 140/72 | HR 74 | Temp 99.0°F | Ht 66.0 in | Wt 184.0 lb

## 2021-08-07 DIAGNOSIS — R5383 Other fatigue: Secondary | ICD-10-CM | POA: Diagnosis not present

## 2021-08-07 DIAGNOSIS — I10 Essential (primary) hypertension: Secondary | ICD-10-CM

## 2021-08-07 MED ORDER — LOSARTAN POTASSIUM 50 MG PO TABS: 50.0000 mg | ORAL_TABLET | Freq: Every day | ORAL | 3 refills | Status: DC

## 2021-08-07 NOTE — Progress Notes (Signed)
   Subjective:    Patient ID: Andres Jennings, male    DOB: 05/30/1975, 46 y.o.   MRN: [id number]  HPI Patient has a history of hypertension.  Blood pressure here is elevated 140/72 however his home blood pressures are averaging in the 120s over 70s.  He is tolerating the losartan without difficulty.  He is due for fasting lab work.  He also reports fatigue and lack of energy and would like to have his testosterone level checked.  His father had coronary artery disease requiring a CABG in his 74s but was also a heavy smoker. Past Medical History:  Diagnosis Date   Anxiety    Asthma    Depression    Eczema    No past surgical history on file. Current Outpatient Medications on File Prior to Visit  Medication Sig Dispense Refill   budesonide-formoterol (SYMBICORT) 80-4.5 MCG/ACT inhaler Inhale 2 puffs into the lungs 2 (two) times daily. 20.4 Inhaler 1   hydrocortisone (ANUSOL-HC) 2.5 % rectal cream Place 1 application rectally 2 (two) times daily. 30 g 0   sertraline (ZOLOFT) 50 MG tablet TAKE 1 TABLET BY MOUTH EVERY DAY 90 tablet 3   triamcinolone cream (KENALOG) 0.1 % Apply 1 application topically 2 (two) times daily. (Patient not taking: Reported on 08/07/2021) 30 g 0   triamcinolone cream (KENALOG) 0.1 % Apply topically 2 (two) times daily. (Patient not taking: Reported on 08/07/2021) 453.6 g 0   No current facility-administered medications on file prior to visit.   No Known Allergies Social History   Socioeconomic History   Marital status: Married    Spouse name: Not on file   Number of children: Not on file   Years of education: Not on file   Highest education level: Not on file  Occupational History   Not on file  Tobacco Use   Smoking status: Never   Smokeless tobacco: Never  Substance and Sexual Activity   Alcohol use: Yes    Comment: Occasional   Drug use: No   Sexual activity: Not on file  Other Topics Concern   Not on file  Social History Narrative   Not on file    Social Determinants of Health   Financial Resource Strain: Not on file  Food Insecurity: Not on file  Transportation Needs: Not on file  Physical Activity: Not on file  Stress: Not on file  Social Connections: Not on file  Intimate Partner Violence: Not on file     Review of Systems  All other systems reviewed and are negative.      Objective:   Physical Exam Vitals reviewed.  Constitutional:      General: He is not in acute distress.    Appearance: Normal appearance. He is not ill-appearing, toxic-appearing or diaphoretic.  Cardiovascular:     Rate and Rhythm: Normal rate and regular rhythm.  Pulmonary:     Effort: Pulmonary effort is normal.     Breath sounds: Normal breath sounds.  Neurological:     Mental Status: He is alert.           Assessment & Plan:  Essential hypertension - Plan: losartan (COZAAR) 50 MG tablet Continue losartan.  Come back fasting for CBC CMP and lipid panel.  Aggressively treat his cholesterol less than 100 given his family history.  If his cholesterol is outstanding, consider a coronary artery calcium score given his strong family history.  Check a testosterone level given his fatigue

## 2021-08-08 ENCOUNTER — Other Ambulatory Visit: Payer: BC Managed Care – PPO

## 2021-08-08 DIAGNOSIS — I10 Essential (primary) hypertension: Secondary | ICD-10-CM | POA: Diagnosis not present

## 2021-08-08 DIAGNOSIS — R5383 Other fatigue: Secondary | ICD-10-CM | POA: Diagnosis not present

## 2021-08-09 LAB — COMPLETE METABOLIC PANEL WITH GFR
AG Ratio: 1.5 (calc) (ref 1.0–2.5)
ALT: 30 U/L (ref 9–46)
AST: 18 U/L (ref 10–40)
Albumin: 4.3 g/dL (ref 3.6–5.1)
Alkaline phosphatase (APISO): 19 U/L — ABNORMAL LOW (ref 36–130)
BUN: 18 mg/dL (ref 7–25)
CO2: 28 mmol/L (ref 20–32)
Calcium: 9.6 mg/dL (ref 8.6–10.3)
Chloride: 105 mmol/L (ref 98–110)
Creat: 0.97 mg/dL (ref 0.60–1.29)
Globulin: 2.8 g/dL (calc) (ref 1.9–3.7)
Glucose, Bld: 94 mg/dL (ref 65–99)
Potassium: 4.5 mmol/L (ref 3.5–5.3)
Sodium: 141 mmol/L (ref 135–146)
Total Bilirubin: 1.1 mg/dL (ref 0.2–1.2)
Total Protein: 7.1 g/dL (ref 6.1–8.1)
eGFR: 98 mL/min/{1.73_m2} (ref >=60)

## 2021-08-09 LAB — CBC WITH DIFFERENTIAL/PLATELET
Absolute Monocytes: 473 cells/uL (ref 200–950)
Basophils Absolute: 21 cells/uL (ref 0–200)
Basophils Relative: 0.4 %
Eosinophils Absolute: 140 cells/uL (ref 15–500)
Eosinophils Relative: 2.7 %
HCT: 46.1 % (ref 38.5–50.0)
Hemoglobin: 15.9 g/dL (ref 13.2–17.1)
Lymphs Abs: 2096 cells/uL (ref 850–3900)
MCH: 32 pg (ref 27.0–33.0)
MCHC: 34.5 g/dL (ref 32.0–36.0)
MCV: 92.8 fL (ref 80.0–100.0)
MPV: 9.8 fL (ref 7.5–12.5)
Monocytes Relative: 9.1 %
Neutro Abs: 2470 cells/uL (ref 1500–7800)
Neutrophils Relative %: 47.5 %
Platelets: 196 10*3/uL (ref 140–400)
RBC: 4.97 10*6/uL (ref 4.20–5.80)
RDW: 12.6 % (ref 11.0–15.0)
Total Lymphocyte: 40.3 %
WBC: 5.2 10*3/uL (ref 3.8–10.8)

## 2021-08-09 LAB — TESTOSTERONE TOTAL,FREE,BIO, MALES
Albumin: 4.3 g/dL (ref 3.6–5.1)
Sex Hormone Binding: 27 nmol/L (ref 10–50)
Testosterone, Bioavailable: 130.3 ng/dL (ref 110.0–575.0)
Testosterone, Free: 66.2 pg/mL (ref 46.0–224.0)
Testosterone: 421 ng/dL (ref 250–827)

## 2021-08-09 LAB — LIPID PANEL
Cholesterol: 184 mg/dL (ref <200)
HDL: 63 mg/dL (ref >=40)
LDL Cholesterol (Calc): 100 mg/dL (calc) — ABNORMAL HIGH
Non-HDL Cholesterol (Calc): 121 mg/dL (calc) (ref <130)
Total CHOL/HDL Ratio: 2.9 (calc) (ref <5.0)
Triglycerides: 113 mg/dL (ref <150)

## 2021-10-17 ENCOUNTER — Other Ambulatory Visit: Payer: Self-pay | Admitting: Family Medicine

## 2021-10-17 NOTE — Telephone Encounter (Signed)
Requested Prescriptions  Pending Prescriptions Disp Refills  . sertraline (ZOLOFT) 50 MG tablet [Pharmacy Med Name: SERTRALINE HCL 50 MG TABLET] 90 tablet 0    Sig: TAKE 1 TABLET BY MOUTH EVERY DAY     Psychiatry:  Antidepressants - SSRI - sertraline Failed - 10/17/2021  2:38 AM      Failed - Completed PHQ-2 or PHQ-9 in the last 360 days      Failed - Valid encounter within last 6 months    Recent Outpatient Visits          1 year ago Hemorrhoids, unspecified hemorrhoid type   [place] Susy Frizzle, MD   2 years ago Chronic midline low back pain without sciatica   [place] Pickard, Cammie Mcgee, MD   3 years ago Screen for STD (sexually transmitted disease)   [place] Delsa Grana, PA-C   4 years ago Eczema, unspecified type   [place] Delsa Grana, PA-C   6 years ago Asthma, mild persistent, uncomplicated   [place] Pickard, Cammie Mcgee, MD             Passed - AST in normal range and within 360 days    AST  Date Value Ref Range Status  08/08/2021 18 10 - 40 U/L Final         Passed - ALT in normal range and within 360 days    ALT  Date Value Ref Range Status  08/08/2021 30 9 - 46 U/L Final

## 2021-11-28 MED ORDER — DUPIXENT 300 MG/2 ML SUBCUTANEOUS PEN INJECTOR
SUBCUTANEOUS | 5 refills | 0 days
Start: 2021-11-28 — End: ?

## 2021-12-12 NOTE — Unmapped (Signed)
[facility] Specialty Medication Onboarding    Specialty Medication: DUPIXENT PEN 300 mg/2 mL Pnij (dupilumab)  Prior Authorization: Approved   Financial Assistance: No - copay  <$25  Final Copay/Day Supply: $0 / 28    Insurance Restrictions: Yes - max 1 month supply     Notes to Pharmacist: n/a    The triage team has completed the benefits investigation and has determined that the patient is able to fill this medication at [facility]. Please contact the patient to complete the onboarding or follow up with the prescribing physician as needed.

## 2021-12-16 NOTE — Unmapped (Signed)
[facility]   Patient Onboarding/Medication Counseling    Adrian Rosario is a 46 y.o. male with atopic dermatitis who I am counseling today on continuation of therapy.  I am speaking to the patient.    Was a Nurse, learning disability used for this call? No    Verified patient's date of birth / HIPAA.    Specialty medication(s) to be sent: Inflammatory Disorders: Dupixent      Non-specialty medications/supplies to be sent: na      Medications not needed at this time: na       The patient declined counseling on medication administration, missed dose instructions, goals of therapy, side effects and monitoring parameters, warnings and precautions, drug/food interactions, and storage, handling precautions, and disposal because they have taken the medication previously. The information in the declined sections below are for informational purposes only and was not discussed with patient.     Patient has bene on this for 2 years . Was due for dose on 12/13     Dupixent (dupilumab)    Medication & Administration     Dosage: Atopic dermatitis: Inject 600mg  under the skin as a loading dose followed by 300mg  every 14 days thereafter (maintenance dosing only)    Administration:     Dupixent Pen  1. Gather all supplies needed for injection on a clean, flat working surface: medication syringe removed from packaging, alcohol swab, sharps container, etc.  2. Look at the medication label - look for correct medication, correct dose, and check the expiration date  3. Look at the medication - the liquid in the pen should appear clear and colorless to pale yellow  4. Lay the pen on a flat surface and allow it to warm up to room temperature for at least 45 minutes  5. Select injection site - you can use the front of your thigh or your belly (but not the area 2 inches around your belly button); if someone else is giving you the injection you can also use your upper arm in the skin covering your triceps muscle  6. Prepare injection site - wash your hands and clean the skin at the injection site with an alcohol swab and let it air dry, do not touch the injection site again before the injection  7. Hold the middle of the body of the pen and gently pull the needle safety cap straight out. Be careful not to bend the needle. Do not remove until immediately prior to injection  8. Press the pen down onto the injection site at a 90 degree angle.   9. You will hear a click as the injection starts, and then a second click when the injection is ALMOST done. Keep holding the pen against the skin for 5 more seconds after the second click.   10. Check that the pen is empty by looking in the viewing window - the yellow indicator bar should be stopped, and should fill the window.   11. Remove the pen from the skin by lifting straight up.   12. Dispose of the used pen immediately in your sharps disposal container  13. If you see any blood at the injection site, press a cotton ball or gauze on the site and maintain pressure until the bleeding stops, do not rub the injection site    Adherence/Missed dose instructions:  If a dose is missed, administer within 7 days from the missed dose and then resume the original schedule. If the missed dose is not administered  within 7 days, you can either wait until the next dose on the original schedule or take your dose now and resume every 14 days from the new injection date. Do not use 2 doses at the same time or extra doses.      Goals of Therapy     -Reduce symptoms of pruritus and dermatitis  -Prevent exacerbations  -Minimize therapeutic risks  -Avoidance of long-term systemic and topical glucocorticoid use  -Maintenance of effective psychosocial functioning    Side Effects & Monitoring Parameters     Injection site reaction (redness, irritation, inflammation localized to the site of administration)  Signs of a common cold - minor sore throat, runny or stuffy nose, etc.  Recurrence of cold sores (herpes simplex)      The following side effects should be reported to the provider:  Signs of a hypersensitivity reaction - rash; hives; itching; red, swollen, blistered, or peeling skin; wheezing; tightness in the chest or throat; difficulty breathing, swallowing, or talking; swelling of the mouth, face, lips, tongue, or throat; etc.  Eye pain or irritation or any visual disturbances  Shortness of breath or worsening of breathing      Contraindications, Warnings, & Precautions     Have your bloodwork checked as you have been told by your prescriber   Birth control pills and other hormone-based birth control may not work as well to prevent pregnancy  Talk with your doctor if you are pregnant, planning to become pregnant, or breastfeeding  Discuss the possible need for holding your dose(s) of Dupixent?? when a planned procedure is scheduled with the prescriber as it may delay healing/recovery timeline       Drug/Food Interactions     Medication list reviewed in Epic. The patient was instructed to inform the care team before taking any new medications or supplements. No drug interactions identified.   Talk with you prescriber or pharmacist before receiving any live vaccinations while taking this medication and after you stop taking it    Storage, Handling Precautions, & Disposal     Store this medication in the refrigerator.  Do not freeze  If needed, you may store at room temperature for up to 14 days  Store in original packaging, protected from light  Do not shake  Dispose of used syringes in a sharps disposal container            Current Medications (including OTC/herbals), Comorbidities and Allergies     Current Outpatient Medications   Medication Sig Dispense Refill    dupilumab (DUPIXENT PEN) 300 mg/2 mL PnIj Inject the contents of 1 pen (300 mg total) under the skin every fourteen (14) days. 4 mL 5     No current facility-administered medications for this visit.       Not on File    There is no problem list on file for this patient.      Reviewed and up to date in Epic.    Appropriateness of Therapy     Acute infections noted within Epic:  No active infections  Patient reported infection: None    Is medication and dose appropriate based on diagnosis and infection status? Yes    Prescription has been clinically reviewed: Yes      Baseline Quality of Life Assessment      How many days over the past month did your atopic dermatitis  keep you from your normal activities? For example, brushing your teeth or getting up in the morning. 0  Financial Information     Medication Assistance provided: None Required    Anticipated copay of $0 reviewed with patient. Verified delivery address.    Delivery Information     Scheduled delivery date: 12/20    Expected start date: 12/20    Medication will be delivered via UPS to the prescription address in [facility].  This shipment will not require a signature.      Explained the services we provide at [facility] and that each month we would call to set up refills.  Stressed importance of returning phone calls so that we could ensure they receive their medications in time each month.  Informed patient that we should be setting up refills 7-10 days prior to when they will run out of medication.  A pharmacist will reach out to perform a clinical assessment periodically.  Informed patient that a welcome packet, containing information about our pharmacy and other support services, a Notice of Privacy Practices, and a drug information handout will be sent.      The patient or caregiver noted above participated in the development of this care plan and knows that they can request review of or adjustments to the care plan at any time.      Patient or caregiver verbalized understanding of the above information as well as how to contact the pharmacy at [phone] option 4 with any questions/concerns.  The pharmacy is open Monday through Friday 8:30am-4:30pm.  A pharmacist is available 24/7 via pager to answer any clinical questions they may have.    Patient Specific Needs     Does the patient have any physical, cognitive, or cultural barriers? No    Does the patient have adequate living arrangements? (i.e. the ability to store and take their medication appropriately) Yes    Did you identify any home environmental safety or security hazards? No    Patient prefers to have medications discussed with  Patient     Is the patient or caregiver able to read and understand education materials at a high school level or above? Yes    Patient's primary language is  English     Is the patient high risk? No    SOCIAL DETERMINANTS OF HEALTH     At the [facility], we have learned that life circumstances - like trouble affording food, housing, utilities, or transportation can affect the health of many of our patients.   That is why we wanted to ask: are you currently experiencing any life circumstances that are negatively impacting your health and/or quality of life? Patient declined to answer    Social Determinants of Health     Financial Resource Strain: Not on file   Internet Connectivity: Not on file   Food Insecurity: Not on file   Tobacco Use: Not on file   Housing/Utilities: Not on file   Alcohol Use: Not on file   Transportation Needs: Not on file   Substance Use: Not on file   Health Literacy: Not on file   Physical Activity: Not on file   Interpersonal Safety: Not on file   Stress: Not on file   Intimate Partner Violence: Not on file   Depression: Not on file   Social Connections: Not on file       Would you be willing to receive help with any of the needs that you have identified today? Not applicable       Geologist, engineering   [facility]  Pharmacy Specialty Pharmacist

## 2021-12-17 MED ORDER — DUPIXENT 300 MG/2 ML SUBCUTANEOUS PEN INJECTOR
SUBCUTANEOUS | 5 refills | 0 days
Start: 2021-12-17 — End: ?

## 2021-12-30 MED FILL — DUPIXENT 300 MG/2 ML SUBCUTANEOUS PEN INJECTOR: SUBCUTANEOUS | 28 days supply | Qty: 4 | Fill #0

## 2022-01-08 ENCOUNTER — Telehealth: Payer: Self-pay | Admitting: Family Medicine

## 2022-01-08 NOTE — Telephone Encounter (Signed)
Patient called to report a 30 second seizure he had while on an airplane flight. Patient stated the neurologist on board advised him to follow up with his family doctor. Patient requesting for Dr. Tanya Nones to call in a script for Xanax while he's out of town;stated he will be back in town on Sunday, and has an appointment for follow up on Tuesday, 01/13/22 at 2:30pm.  Pharmacy: [facility] [address] [place], [place] [address] [phone]  Please advise patient at [phone].

## 2022-01-13 ENCOUNTER — Ambulatory Visit: Payer: BC Managed Care – PPO | Admitting: Family Medicine

## 2022-01-15 ENCOUNTER — Other Ambulatory Visit: Payer: Self-pay | Admitting: Family Medicine

## 2022-01-16 ENCOUNTER — Encounter: Payer: Self-pay | Admitting: Neurology

## 2022-01-16 ENCOUNTER — Ambulatory Visit (INDEPENDENT_AMBULATORY_CARE_PROVIDER_SITE_OTHER): Payer: BC Managed Care – PPO | Admitting: Family Medicine

## 2022-01-16 VITALS — BP 126/72 | HR 71 | Ht 66.0 in | Wt 185.4 lb

## 2022-01-16 DIAGNOSIS — R569 Unspecified convulsions: Secondary | ICD-10-CM | POA: Diagnosis not present

## 2022-01-16 NOTE — Progress Notes (Signed)
Subjective:    Patient ID: Andres Jennings, male    DOB: 10/27/1975, 47 y.o.   MRN: [id number]  HPI Patient has a history of anxiety.  He was recently traveling over Christmas.  They were in [facility]  He had some type of upper respiratory tract infection prior to flying.  He did not feel well and was taking cold medication.  He also had two beers prior to his plane flight.  Once on the plane, he fell asleep.  When he woke up he felt extremely weak and tired and sleepy.  He did not feel well.  He remembers feeling anxious at the moment he woke up about how poorly he felt.  Within a few seconds he lost consciousness.  He had a witnessed generalized seizure that lasted 30 seconds and resolved spontaneously.  There was no bowel or bladder incontinence.  He did not bite his tongue.  There was no postictal phase.  He denies any headache or neurologic deficits.  Today his neurologic exam is normal.  He regained consciousness within 30 seconds.  He was immediately alert and able to answer questions appropriately with no obvious patient or neurologic deficits.  He is here today for follow-up Past Medical History:  Diagnosis Date   Anxiety    Asthma    Depression    Eczema    No past surgical history on file. Current Outpatient Medications on File Prior to Visit  Medication Sig Dispense Refill   budesonide-formoterol (SYMBICORT) 80-4.5 MCG/ACT inhaler Inhale 2 puffs into the lungs 2 (two) times daily. 20.4 Inhaler 1   Dupilumab (DUPIXENT [place]) Inject into the skin.     losartan (COZAAR) 50 MG tablet Take 1 tablet (50 mg total) by mouth daily. 90 tablet 3   sertraline (ZOLOFT) 50 MG tablet TAKE 1 TABLET BY MOUTH EVERY DAY 60 tablet 0   No current facility-administered medications on file prior to visit.   No Known Allergies Social History   Socioeconomic History   Marital status: Married    Spouse name: Not on file   Number of children: Not on file   Years of education: Not on file   Highest  education level: Not on file  Occupational History   Not on file  Tobacco Use   Smoking status: Never   Smokeless tobacco: Never  Substance and Sexual Activity   Alcohol use: Yes    Comment: Occasional   Drug use: No   Sexual activity: Not on file  Other Topics Concern   Not on file  Social History Narrative   Not on file   Social Determinants of Health   Financial Resource Strain: Not on file  Food Insecurity: Not on file  Transportation Needs: Not on file  Physical Activity: Not on file  Stress: Not on file  Social Connections: Not on file  Intimate Partner Violence: Not on file     Review of Systems  All other systems reviewed and are negative.      Objective:   Physical Exam Vitals reviewed.  Constitutional:      General: He is not in acute distress.    Appearance: Normal appearance. He is not ill-appearing, toxic-appearing or diaphoretic.  Cardiovascular:     Rate and Rhythm: Normal rate and regular rhythm.  Pulmonary:     Effort: Pulmonary effort is normal.     Breath sounds: Normal breath sounds.  Neurological:     General: No focal deficit present.     Mental  Status: He is alert and oriented to person, place, and time. Mental status is at baseline.     Cranial Nerves: No cranial nerve deficit.     Sensory: No sensory deficit.     Motor: No weakness.     Coordination: Coordination normal.     Gait: Gait normal.           Assessment & Plan:  Seizure ([place]) - Plan: CBC with Differential/Platelet, COMPLETE METABOLIC PANEL WITH GFR I believe that this was a vasovagal reaction.  I believe his blood pressure was low due to his viral illness.  I believe the alcohol likely lowered his blood pressure coupled with his blood pressure medication.  I then believe he had a panic attack when he woke up and had a vasovagal reaction.  I believe this is what he reports when he states that he did not feel well and he felt lightheaded.  I believe that this triggered a  vasovagal reaction and due to hypotension he lost consciousness and then suffered a seizure afterwards due to decreased cerebral perfusion from hypotension.  I believe that he immediately regained consciousness with no postictal phase which would be unlikely.  Therefore, I believe it is unlikely that he will have another seizure and does not require antiseizure medication at this time.  I will consult neurology for consideration of any EEG and neuroimaging.  I will defer to their recommendations regarding the neuroimaging.  I will check a CBC and a CMP to evaluate for any electrolyte disturbances that may have triggered his seizure.

## 2022-01-17 LAB — CBC WITH DIFFERENTIAL/PLATELET
Absolute Monocytes: 499 cells/uL (ref 200–950)
Basophils Absolute: 17 cells/uL (ref 0–200)
Basophils Relative: 0.3 %
Eosinophils Absolute: 99 cells/uL (ref 15–500)
Eosinophils Relative: 1.7 %
HCT: 47.4 % (ref 38.5–50.0)
Hemoglobin: 16.4 g/dL (ref 13.2–17.1)
Lymphs Abs: 2250 cells/uL (ref 850–3900)
MCH: 31.6 pg (ref 27.0–33.0)
MCHC: 34.6 g/dL (ref 32.0–36.0)
MCV: 91.3 fL (ref 80.0–100.0)
MPV: 9.7 fL (ref 7.5–12.5)
Monocytes Relative: 8.6 %
Neutro Abs: 2935 cells/uL (ref 1500–7800)
Neutrophils Relative %: 50.6 %
Platelets: 222 10*3/uL (ref 140–400)
RBC: 5.19 10*6/uL (ref 4.20–5.80)
RDW: 12.6 % (ref 11.0–15.0)
Total Lymphocyte: 38.8 %
WBC: 5.8 10*3/uL (ref 3.8–10.8)

## 2022-01-17 LAB — COMPLETE METABOLIC PANEL WITH GFR
AG Ratio: 1.5 (calc) (ref 1.0–2.5)
ALT: 37 U/L (ref 9–46)
AST: 19 U/L (ref 10–40)
Albumin: 4.3 g/dL (ref 3.6–5.1)
Alkaline phosphatase (APISO): 21 U/L — ABNORMAL LOW (ref 36–130)
BUN: 17 mg/dL (ref 7–25)
CO2: 29 mmol/L (ref 20–32)
Calcium: 9.3 mg/dL (ref 8.6–10.3)
Chloride: 102 mmol/L (ref 98–110)
Creat: 0.93 mg/dL (ref 0.60–1.29)
Globulin: 2.8 g/dL (calc) (ref 1.9–3.7)
Glucose, Bld: 88 mg/dL (ref 65–99)
Potassium: 4.3 mmol/L (ref 3.5–5.3)
Sodium: 140 mmol/L (ref 135–146)
Total Bilirubin: 1.1 mg/dL (ref 0.2–1.2)
Total Protein: 7.1 g/dL (ref 6.1–8.1)
eGFR: 102 mL/min/{1.73_m2} (ref >=60)

## 2022-01-19 NOTE — Unmapped (Signed)
[facility] Refill Coordination Note    Patient will be getting back to [place] in regards to copay card information    Adrian Rosario, DOB: 04-08-1975  Phone: There are no phone numbers on file.      All above HIPAA information was verified with patient.         01/19/2022    12:27 PM   Specialty Rx Medication Refill Questionnaire   Which Medications would you like refilled and shipped? Dupixent   Please list all current allergies: None   Have you missed any doses in the last 30 days? No   Have you had any changes to your medication(s) since your last refill? No   How many days remaining of each medication do you have at home? None   If receiving an injectable medication, next injection date is 01/29/2022   Have you experienced any side effects in the last 30 days? No   Please enter the full address (street address, city, state, zip code) where you would like your medication(s) to be delivered to. [address], [place] [address]   Please specify on which day you would like your medication(s) to arrive. Note: if you need your medication(s) within 3 days, please call the pharmacy to schedule your order at [phone]  01/23/2022   Has your insurance changed since your last refill? No   Would you like a pharmacist to call you to discuss your medication(s)? No   Do you require a signature for your package? (Note: if we are billing Medicare Part B or your order contains a controlled substance, we will require a signature) No         Completed refill call assessment today to schedule patient's medication shipment from the [facility] [phone]).  All relevant notes have been reviewed.       Confirmed patient received a Conservation officer, historic buildings and a Surveyor, mining with first shipment. The patient will receive a drug information handout for each medication shipped and additional FDA Medication Guides as required.         REFERRAL TO PHARMACIST     Referral to the pharmacist: Not needed      [facility]     Shipping address confirmed in Epic.     Delivery Scheduled: Yes, Expected medication delivery date: 1/12.     Medication will be delivered via UPS to the prescription address in Epic [facility].    Teofilo Pod, PharmD   [facility] Specialty Pharmacist

## 2022-01-21 DIAGNOSIS — L209 Atopic dermatitis, unspecified: Principal | ICD-10-CM

## 2022-01-22 NOTE — Unmapped (Signed)
Adrian Rosario 's DUPIXENT PEN 300 mg/2 mL Pnij (dupilumab) shipment will be delayed as a result of a high copay.     I have reached out to the patient  at [phone] and communicated the delay. We will wait for a call back from the patient to reschedule the delivery.  We have not confirmed the new delivery date.

## 2022-01-23 ENCOUNTER — Ambulatory Visit: Payer: BC Managed Care – PPO | Admitting: Neurology

## 2022-01-23 ENCOUNTER — Encounter: Payer: Self-pay | Admitting: Neurology

## 2022-01-23 VITALS — BP 176/94 | HR 82 | Ht 67.0 in | Wt 186.4 lb

## 2022-01-23 DIAGNOSIS — R55 Syncope and collapse: Secondary | ICD-10-CM | POA: Diagnosis not present

## 2022-01-23 NOTE — Progress Notes (Signed)
NEUROLOGY CONSULTATION NOTE  Andres Jennings MRN: [id number] DOB: May 23, 1975  Referring provider: Dr. Jenna Luo Primary care provider: Dr. Jenna Luo  Reason for consult:  seizure  Dear Dr Dennard Schaumann:  Thank you for your kind referral of Andres Jennings for consultation of the above symptoms. Although his history is well known to you, please allow me to reiterate it for the purpose of our medical record. He is alone in the office today. Records and images were personally reviewed where available.   HISTORY OF PRESENT ILLNESS: This is a 47 year old right-handed man with a history of hypertension, anxiety, presenting for evaluation of an episode of loss of consciousness that occurred on 01/07/22. His family had been sick over Christmas, on 12/26 he started feeling congested. On 12/27, he woke up still feeling bad and took Mucinex. He drove to the airport in [place] and had lunch in the airport with 2 beers. On the plane, he recalls falling asleep and waking up initially feeling fine, then he felt tired and lay back to go back to sleep. He has a history of rare instances where he gets a fleeting feeling in his head where he feels lightheaded like he will pass out. He usually taps his foot and it goes away. He recalls feeling this way so he tapped his foot, then the next thing he recalls is 30 seconds later with no clue what happened. His wife reported that it looked like he was drawing 3-4 deep breaths with his eyes open while straightening his body (he has no recollection of this), then passed out. No convulsive activity reported. He was out for 30 seconds, then he sat up abruptly and was able to answer all orientation questions. No tongue bite or incontinence. There was a neurologist on the flight who told him it was not a medical emergency and to see his PCP. He got off the plane fine. He did go to Urgent Care due to overall feeling unwell and was prescribed 5 days of Prednisone. He denies any  further similar episodes but has been feeling quite anxious and nervous about what happened. The fleeting feelings of feeling lightheaded occur around three times a year, usually with a change in pressure in the room around him, or with social anxiety. He denies any staring/unresponsive episodes, gaps in time, olfactory/gustatory hallucinations, deja vu, rising epigastric sensation, focal numbness/tingling/weakness, myoclonic jerks. He denies any headaches, dizziness, diplopia, dysarthria/dysphagia, neck pain, bowel/bladder dysfunction. He has occasional back pain. He was probably sleep deprived the night prior for their early flight. No other alcohol intake from the night before that he can recall. Memory is pretty good. He works in Interior and spatial designer. He had a normal birth and early development.  There is no history of febrile convulsions, CNS infections such as meningitis/encephalitis, significant traumatic brain injury, neurosurgical procedures, or family history of seizures.   PAST MEDICAL HISTORY: Past Medical History:  Diagnosis Date   Anxiety    Asthma    Depression    Eczema     PAST SURGICAL HISTORY: History reviewed. No pertinent surgical history.  MEDICATIONS: Current Outpatient Medications on File Prior to Visit  Medication Sig Dispense Refill   budesonide-formoterol (SYMBICORT) 80-4.5 MCG/ACT inhaler Inhale 2 puffs into the lungs 2 (two) times daily. 20.4 Inhaler 1   Dupilumab (DUPIXENT Lemay) Inject into the skin.     losartan (COZAAR) 50 MG tablet Take 1 tablet (50 mg total) by mouth daily. 90 tablet 3   sertraline (ZOLOFT)  50 MG tablet TAKE 1 TABLET BY MOUTH EVERY DAY 60 tablet 0   No current facility-administered medications on file prior to visit.    ALLERGIES: No Known Allergies  FAMILY HISTORY: Family History  Problem Relation Age of Onset   Heart disease Father     SOCIAL HISTORY: Social History   Socioeconomic History   Marital status: Married     Spouse name: Not on file   Number of children: Not on file   Years of education: Not on file   Highest education level: Not on file  Occupational History   Not on file  Tobacco Use   Smoking status: Never   Smokeless tobacco: Never  Vaping Use   Vaping Use: Never used  Substance and Sexual Activity   Alcohol use: Yes    Comment: Occasional   Drug use: No   Sexual activity: Not on file  Other Topics Concern   Not on file  Social History Narrative   Are you right handed or left handed? Right    Are you currently employed ? yes   What is your current occupation? [employer]    Do you live at home alone? no   Who lives with you? Wife 3 kids   What type of home do you live in: 1 story or 2 story? 3 story        Social Determinants of Radio broadcast assistant Strain: Not on file  Food Insecurity: Not on file  Transportation Needs: Not on file  Physical Activity: Not on file  Stress: Not on file  Social Connections: Not on file  Intimate Partner Violence: Not on file     PHYSICAL EXAM: Vitals:   01/23/22 1017  BP: (!) 176/94  Pulse: 82  SpO2: 100%   General: No acute distress Head:  Normocephalic/atraumatic Skin/Extremities: No rash, no edema Neurological Exam: Mental status: alert and oriented to person, place, and time, no dysarthria or aphasia, Fund of knowledge is appropriate.  Recent and remote memory are intact, 3/3 delayed recall.  Attention and concentration are normal, 5/5 WORLD backwards. Cranial nerves: CN I: not tested CN II: pupils equal, round, visual fields intact CN III, IV, VI:  full range of motion, no nystagmus, no ptosis CN V: facial sensation intact CN VII: upper and lower face symmetric CN VIII: hearing intact to conversation Bulk & Tone: normal, no fasciculations. Motor: 5/5 throughout with no pronator drift. Sensation: intact to light touch, cold, pin, vibration sense.  No extinction to double simultaneous stimulation.  Romberg test  negative Deep Tendon Reflexes: +2 throughout Cerebellar: no incoordination on finger to nose testing Gait: narrow-based and steady, able to tandem walk adequately. Tremor: none   IMPRESSION: This is a 47 year old right-handed man with a history of hypertension, anxiety, presenting for evaluation of an episode of loss of consciousness that occurred on 01/07/22, no post-event confusion. His neurological exam is normal, no clear epilepsy risk factors. We discussed that episode was most likely vasovagal in nature, we will do an EEG for completion. [place] driving laws were discussed with the patient, and he knows to stop driving after a seizure, until 6 months seizure-free. Our office will call with results, if normal, follow-up as needed. He knows to call for any changes.    Thank you for allowing me to participate in the care of this patient. Please do not hesitate to call for any questions or concerns.   Ellouise Newer, M.D.  CC: Dr. Dennard Schaumann

## 2022-01-23 NOTE — Patient Instructions (Signed)
Good to meet you. Schedule 1-hour EEG. Our office will call with results and further steps if needed. Call for any changes.    Seizure Precautions: 1. If medication has been prescribed for you to prevent seizures, take it exactly as directed.  Do not stop taking the medicine without talking to your doctor first, even if you have not had a seizure in a long time.   2. Avoid activities in which a seizure would cause danger to yourself or to others.  Don't operate dangerous machinery, swim alone, or climb in high or dangerous places, such as on ladders, roofs, or girders.  Do not drive unless your doctor says you may.  3. If you have any warning that you may have a seizure, lay down in a safe place where you can't hurt yourself.    4.  No driving for 6 months from last seizure, as per [facility]   Please refer to the following link on the [place] website for more information: http://[email or web address].org/[email or web address]/driving/drivingu.cfm   5.  Maintain good sleep hygiene. Avoid alcohol.  6.  Contact your doctor if you have any problems that may be related to the medicine you are taking.  7.  Call 911 and bring the patient back to the ED if:        A.  The seizure lasts longer than 5 minutes.       B.  The patient doesn't awaken shortly after the seizure  C.  The patient has new problems such as difficulty seeing, speaking or moving  D.  The patient was injured during the seizure  E.  The patient has a temperature over 102 F (39C)  F.  The patient vomited and now is having trouble breathing

## 2022-01-27 ENCOUNTER — Other Ambulatory Visit: Payer: BC Managed Care – PPO

## 2022-01-29 ENCOUNTER — Other Ambulatory Visit: Payer: BC Managed Care – PPO

## 2022-01-29 NOTE — Unmapped (Signed)
Adrian Rosario 's DUPIXENT PEN 300 mg/2 mL Pnij (dupilumab) shipment will be canceled  as a result of a high copay.     I have reached out to the patient  at [phone] and communicated the delivery change. We will not reschedule the medication and have removed this/these medication(s) from the work request.  We have canceled this work request.     Note: 3 failed attempts (1/10, 1/15, 1/16) need dupixent debit card info

## 2022-02-03 NOTE — Unmapped (Signed)
Adrian Rosario called back with debit card information. Rescheduled for UPS delivery to prescription address on 1.25.     Adrian Rosario A. Adrian Rosario, PharmD, BCPS - Pharmacist   [facility]

## 2022-02-04 ENCOUNTER — Other Ambulatory Visit: Payer: BC Managed Care – PPO

## 2022-02-04 MED FILL — DUPIXENT 300 MG/2 ML SUBCUTANEOUS PEN INJECTOR: SUBCUTANEOUS | 28 days supply | Qty: 4 | Fill #1

## 2022-02-09 ENCOUNTER — Encounter: Payer: Self-pay | Admitting: Family Medicine

## 2022-02-09 ENCOUNTER — Ambulatory Visit (INDEPENDENT_AMBULATORY_CARE_PROVIDER_SITE_OTHER): Payer: BC Managed Care – PPO | Admitting: Family Medicine

## 2022-02-09 VITALS — BP 138/80 | HR 75 | Temp 98.6°F | Ht 67.0 in | Wt 186.4 lb

## 2022-02-09 DIAGNOSIS — F411 Generalized anxiety disorder: Secondary | ICD-10-CM

## 2022-02-09 MED ORDER — ALBUTEROL SULFATE HFA 108 (90 BASE) MCG/ACT IN AERS: 2.0000 | INHALATION_SPRAY | Freq: Four times a day (QID) | RESPIRATORY_TRACT | 3 refills | Status: DC | PRN

## 2022-02-09 MED ORDER — ALPRAZOLAM 1 MG PO TABS: 1.0000 mg | ORAL_TABLET | Freq: Three times a day (TID) | ORAL | 0 refills | Status: DC | PRN

## 2022-02-09 MED ORDER — ESCITALOPRAM OXALATE 10 MG PO TABS: 10.0000 mg | ORAL_TABLET | Freq: Every day | ORAL | 5 refills | Status: DC

## 2022-02-09 NOTE — Progress Notes (Signed)
Subjective:    Patient ID: Andres Jennings, male    DOB: 07-12-1975, 47 y.o.   MRN: [id number]  HPI 01/16/22 Patient has a history of anxiety.  He was recently traveling over Christmas.  They were in [place] [facility].  He had some type of upper respiratory tract infection prior to flying.  He did not feel well and was taking cold medication.  He also had two beers prior to his plane flight.  Once on the plane, he fell asleep.  When he woke up he felt extremely weak and tired and sleepy.  He did not feel well.  He remembers feeling anxious at the moment he woke up about how poorly he felt.  Within a few seconds he lost consciousness.  He had a witnessed generalized seizure that lasted 30 seconds and resolved spontaneously.  There was no bowel or bladder incontinence.  He did not bite his tongue.  There was no postictal phase.  He denies any headache or neurologic deficits.  Today his neurologic exam is normal.  He regained consciousness within 30 seconds.  He was immediately alert and able to answer questions appropriately with no obvious patient or neurologic deficits.  He is here today for follow-up.  At that time, my plan was: I believe that this was a vasovagal reaction.  I believe his blood pressure was low due to his viral illness.  I believe the alcohol likely lowered his blood pressure coupled with his blood pressure medication.  I then believe he had a panic attack when he woke up and had a vasovagal reaction.  I believe this is what he reports when he states that he did not feel well and he felt lightheaded.  I believe that this triggered a vasovagal reaction and due to hypotension he lost consciousness and then suffered a seizure afterwards due to decreased cerebral perfusion from hypotension.  I believe that he immediately regained consciousness with no postictal phase which would be unlikely.  Therefore, I believe it is unlikely that he will have another seizure and does not require antiseizure medication  at this time.  I will consult neurology for consideration of any EEG and neuroimaging.  I will defer to their recommendations regarding the neuroimaging.  I will check a CBC and a CMP to evaluate for any electrolyte disturbances that may have triggered his seizure.  02/09/22 Patient has met with neurology who agrees that this was likely a vasovagal reaction.  He would like to try to treat his anxiety better.  He has been on sertraline for 20 years at a very low dose.  Initially saw benefit with depression.  However now he feels that the medication has little effect.  He denies any depression but he does report feeling anxious and having occasional panic attacks.  He tends to have anxiety stemming from concerns about his health care.  If he feels "off", his anxiety can rapidly spin out of control. Past Medical History:  Diagnosis Date   Anxiety    Asthma    Depression    Eczema    No past surgical history on file. Current Outpatient Medications on File Prior to Visit  Medication Sig Dispense Refill   budesonide-formoterol (SYMBICORT) 80-4.5 MCG/ACT inhaler Inhale 2 puffs into the lungs 2 (two) times daily. 20.4 Inhaler 1   Dupilumab (DUPIXENT Perry) Inject into the skin.     losartan (COZAAR) 50 MG tablet Take 1 tablet (50 mg total) by mouth daily. 90 tablet 3  sertraline (ZOLOFT) 50 MG tablet TAKE 1 TABLET BY MOUTH EVERY DAY 60 tablet 0   No current facility-administered medications on file prior to visit.   No Known Allergies Social History   Socioeconomic History   Marital status: Married    Spouse name: Not on file   Number of children: Not on file   Years of education: Not on file   Highest education level: Not on file  Occupational History   Not on file  Tobacco Use   Smoking status: Never   Smokeless tobacco: Never  Vaping Use   Vaping Use: Never used  Substance and Sexual Activity   Alcohol use: Yes    Comment: Occasional   Drug use: No   Sexual activity: Not on file   Other Topics Concern   Not on file  Social History Narrative   Are you right handed or left handed? Right    Are you currently employed ? yes   What is your current occupation? [employer]    Do you live at home alone? no   Who lives with you? Wife 3 kids   What type of home do you live in: 1 story or 2 story? 3 story        Social Determinants of Radio broadcast assistant Strain: Not on file  Food Insecurity: Not on file  Transportation Needs: Not on file  Physical Activity: Not on file  Stress: Not on file  Social Connections: Not on file  Intimate Partner Violence: Not on file     Review of Systems  All other systems reviewed and are negative.      Objective:   Physical Exam Vitals reviewed.  Constitutional:      General: He is not in acute distress.    Appearance: Normal appearance. He is not ill-appearing, toxic-appearing or diaphoretic.  Cardiovascular:     Rate and Rhythm: Normal rate and regular rhythm.  Pulmonary:     Effort: Pulmonary effort is normal.     Breath sounds: Normal breath sounds.  Neurological:     General: No focal deficit present.     Mental Status: He is alert and oriented to person, place, and time. Mental status is at baseline.     Cranial Nerves: No cranial nerve deficit.     Sensory: No sensory deficit.     Motor: No weakness.     Coordination: Coordination normal.     Gait: Gait normal.      .     Assessment & Plan:  GAD (generalized anxiety disorder) Patient has anxiety revolving around flying.  I will give him Xanax 1 mg tablets.  He can take 1 every 8 hours as needed particular around the time he has to fly on an airplane.  I will also switch the patient from sertraline to Lexapro 10 mg a day in an effort to help manage the anxiety more effectively.  I believe his body has grown quite used to the Zoloft and it may be less effective for him

## 2022-02-16 ENCOUNTER — Ambulatory Visit: Payer: BC Managed Care – PPO | Admitting: Neurology

## 2022-02-19 ENCOUNTER — Encounter: Payer: Self-pay | Admitting: Neurology

## 2022-02-19 ENCOUNTER — Other Ambulatory Visit: Payer: BC Managed Care – PPO

## 2022-02-19 DIAGNOSIS — Z029 Encounter for administrative examinations, unspecified: Secondary | ICD-10-CM

## 2022-03-03 NOTE — Unmapped (Signed)
[facility] Refill Coordination Note    Adrian Rosario, DOB: [birth year]  Phone: There are no phone numbers on file.      All above HIPAA information was verified with patient.         03/01/2022     2:37 PM   Specialty Rx Medication Refill Questionnaire   Which Medications would you like refilled and shipped? Dupixent   Please list all current allergies: None   Have you missed any doses in the last 30 days? No   Have you had any changes to your medication(s) since your last refill? No   How many days remaining of each medication do you have at home? 0   If receiving an injectable medication, next injection date is 03/04/2022   Have you experienced any side effects in the last 30 days? No   Please enter the full address (street address, city, state, zip code) where you would like your medication(s) to be delivered to. Adrian Rosario, [address], [place] [address]   Please specify on which day you would like your medication(s) to arrive. Note: if you need your medication(s) within 3 days, please call the pharmacy to schedule your order at [phone]  03/03/2022   Has your insurance changed since your last refill? No   Would you like a pharmacist to call you to discuss your medication(s)? No   Do you require a signature for your package? (Note: if we are billing Medicare Part B or your order contains a controlled substance, we will require a signature) No         Completed refill call assessment today to schedule patient's medication shipment from the [facility] [phone]).  All relevant notes have been reviewed.       Confirmed patient received a Conservation officer, historic buildings and a Surveyor, mining with first shipment. The patient will receive a drug information handout for each medication shipped and additional FDA Medication Guides as required.         REFERRAL TO PHARMACIST     Referral to the pharmacist: Not needed      [facility]     Shipping address confirmed in Epic. Delivery Scheduled: Yes, Expected medication delivery date: 03/05/22.     Medication will be delivered via UPS to the prescription address in Epic WAM.    Willette Pa   [facility] Specialty Technician

## 2022-03-04 MED FILL — DUPIXENT 300 MG/2 ML SUBCUTANEOUS PEN INJECTOR: SUBCUTANEOUS | 28 days supply | Qty: 4 | Fill #2

## 2022-03-06 ENCOUNTER — Other Ambulatory Visit: Payer: Self-pay | Admitting: Family Medicine

## 2022-03-19 ENCOUNTER — Other Ambulatory Visit: Payer: Self-pay | Admitting: Family Medicine

## 2022-04-01 NOTE — Unmapped (Signed)
[facility] Refill Coordination Note    Adrian Rosario, DOB: [birth year]  Phone: There are no phone numbers on file.      All above HIPAA information was verified with patient.         04/01/2022     8:15 AM   Specialty Rx Medication Refill Questionnaire   Which Medications would you like refilled and shipped? Dupixent   Please list all current allergies: None   Have you missed any doses in the last 30 days? Yes   If Yes, please choose the number of missed doses below: 0-2   Have you had any changes to your medication(s) since your last refill? No   How many days remaining of each medication do you have at home? None   If receiving an injectable medication, next injection date is 03/25/2022   Have you experienced any side effects in the last 30 days? No   Please enter the full address (street address, city, state, zip code) where you would like your medication(s) to be delivered to. [address] , [place] [address]   Please specify on which day you would like your medication(s) to arrive. Note: if you need your medication(s) within 3 days, please call the pharmacy to schedule your order at [phone]  04/06/2022   Has your insurance changed since your last refill? No   Would you like a pharmacist to call you to discuss your medication(s)? No   Do you require a signature for your package? (Note: if we are billing Medicare Part B or your order contains a controlled substance, we will require a signature) No   Additional Comments: I???ve tried to refill three times and it never seems to go through.         Completed refill call assessment today to schedule patient's medication shipment from the [facility] [phone]).  All relevant notes have been reviewed.       Confirmed patient received a Conservation officer, historic buildings and a Surveyor, mining with first shipment. The patient will receive a drug information handout for each medication shipped and additional FDA Medication Guides as required.         REFERRAL TO PHARMACIST     Referral to the pharmacist: Not needed      [facility]     Shipping address confirmed in Epic.     Delivery Scheduled: Yes, Expected medication delivery date: 04/07/22.     Medication will be delivered via UPS to the prescription address in Epic WAM.    Willette Pa   [facility] Specialty Technician

## 2022-04-06 MED FILL — DUPIXENT 300 MG/2 ML SUBCUTANEOUS PEN INJECTOR: SUBCUTANEOUS | 28 days supply | Qty: 4 | Fill #3

## 2022-04-21 NOTE — Unmapped (Signed)
[facility] Refill Coordination Note    Adrian Rosario, DOB: 1975/12/30  Phone: There are no phone numbers on file.      All above HIPAA information was verified with patient.         04/21/2022    11:41 AM   Specialty Rx Medication Refill Questionnaire   Which Medications would you like refilled and shipped? Dupixent   Please list all current allergies: None   Have you missed any doses in the last 30 days? No   Have you had any changes to your medication(s) since your last refill? No   How many days remaining of each medication do you have at home? None   If receiving an injectable medication, next injection date is 05/04/2022   Have you experienced any side effects in the last 30 days? No   Please enter the full address (street address, city, state, zip code) where you would like your medication(s) to be delivered to. [address], [place] [address]   Please specify on which day you would like your medication(s) to arrive. Note: if you need your medication(s) within 3 days, please call the pharmacy to schedule your order at [phone]  05/01/2022   Has your insurance changed since your last refill? No   Would you like a pharmacist to call you to discuss your medication(s)? No   Do you require a signature for your package? (Note: if we are billing Medicare Part B or your order contains a controlled substance, we will require a signature) No         Completed refill call assessment today to schedule patient's medication shipment from the [facility] [phone]).  All relevant notes have been reviewed.       Confirmed patient received a Conservation officer, historic buildings and a Surveyor, mining with first shipment. The patient will receive a drug information handout for each medication shipped and additional FDA Medication Guides as required.         REFERRAL TO PHARMACIST     Referral to the pharmacist: Not needed      [facility]     Shipping address confirmed in Epic.     Delivery Scheduled: Yes, Expected medication delivery date: 4/19.     Medication will be delivered via UPS to the temporary address in Epic WAM.    Teofilo Pod, PharmD   [facility] Specialty Pharmacist

## 2022-04-30 MED FILL — DUPIXENT 300 MG/2 ML SUBCUTANEOUS PEN INJECTOR: SUBCUTANEOUS | 28 days supply | Qty: 4 | Fill #4

## 2022-05-18 NOTE — Unmapped (Signed)
[facility] Refill Coordination Note    Qasem Rudolf, DOB: [birth year]  Phone: There are no phone numbers on file.      All above HIPAA information was verified with patient.         05/18/2022    10:07 AM   Specialty Rx Medication Refill Questionnaire   Which Medications would you like refilled and shipped? Dupixent   Please list all current allergies: None   Have you missed any doses in the last 30 days? No   Have you had any changes to your medication(s) since your last refill? No   How many days remaining of each medication do you have at home? None   If receiving an injectable medication, next injection date is 06/01/2022   Have you experienced any side effects in the last 30 days? No   Please enter the full address (street address, city, state, zip code) where you would like your medication(s) to be delivered to. [address], [place]  [place] [address]   Please specify on which day you would like your medication(s) to arrive. Note: if you need your medication(s) within 3 days, please call the pharmacy to schedule your order at [phone]  05/29/2022   Has your insurance changed since your last refill? No   Would you like a pharmacist to call you to discuss your medication(s)? No   Do you require a signature for your package? (Note: if we are billing Medicare Part B or your order contains a controlled substance, we will require a signature) No         Completed refill call assessment today to schedule patient's medication shipment from the [facility] [phone]).  All relevant notes have been reviewed.       Confirmed patient received a Conservation officer, historic buildings and a Surveyor, mining with first shipment. The patient will receive a drug information handout for each medication shipped and additional FDA Medication Guides as required.         REFERRAL TO PHARMACIST     Referral to the pharmacist: Not needed      [facility]     Shipping address confirmed in Epic.     Delivery Scheduled: Yes, Expected medication delivery date: 5/16.     Medication will be delivered via UPS to the temporary address in Epic WAM.    [name] Shared [facility] Specialty Technician

## 2022-05-28 MED FILL — DUPIXENT 300 MG/2 ML SUBCUTANEOUS PEN INJECTOR: SUBCUTANEOUS | 28 days supply | Qty: 4 | Fill #0

## 2022-06-18 NOTE — Unmapped (Signed)
[facility] Refill Coordination Note    Adrian Rosario, DOB: 02/04/1975  Phone: There are no phone numbers on file.      All above HIPAA information was verified with patient.         06/17/2022     9:17 PM   Specialty Rx Medication Refill Questionnaire   Which Medications would you like refilled and shipped? Dupixent   Please list all current allergies: None   Have you missed any doses in the last 30 days? No   Have you had any changes to your medication(s) since your last refill? No   How many days remaining of each medication do you have at home? O   If receiving an injectable medication, next injection date is 07/01/2022   Have you experienced any side effects in the last 30 days? No   Please enter the full address (street address, city, state, zip code) where you would like your medication(s) to be delivered to. [address], [place] [address]   Please specify on which day you would like your medication(s) to arrive. Note: if you need your medication(s) within 3 days, please call the pharmacy to schedule your order at [phone]  06/29/2022   Has your insurance changed since your last refill? No   Would you like a pharmacist to call you to discuss your medication(s)? No   Do you require a signature for your package? (Note: if we are billing Medicare Part B or your order contains a controlled substance, we will require a signature) No         Completed refill call assessment today to schedule patient's medication shipment from the [facility] [phone]).  All relevant notes have been reviewed.       Confirmed patient received a Conservation officer, historic buildings and a Surveyor, mining with first shipment. The patient will receive a drug information handout for each medication shipped and additional FDA Medication Guides as required.         REFERRAL TO PHARMACIST     Referral to the pharmacist: Not needed      [facility]     Shipping address confirmed in Epic.     Delivery Scheduled: Yes, Expected medication delivery date: 06/30/2022.     Medication will be delivered via UPS to the temporary address in Epic WAM.    Dorisann Frames   [facility] Shared [facility] Specialty Technician

## 2022-06-29 MED FILL — DUPIXENT 300 MG/2 ML SUBCUTANEOUS PEN INJECTOR: SUBCUTANEOUS | 28 days supply | Qty: 4 | Fill #5

## 2022-07-21 NOTE — Unmapped (Signed)
[facility] Refill Coordination Note    Adrian Rosario, DOB: 1975-02-02  Phone: There are no phone numbers on file.      All above HIPAA information was verified with patient.         07/21/2022     1:11 PM   Specialty Rx Medication Refill Questionnaire   Which Medications would you like refilled and shipped? Dupixent   Please list all current allergies: None   Have you missed any doses in the last 30 days? No   Have you had any changes to your medication(s) since your last refill? No   How many days remaining of each medication do you have at home? 0   If receiving an injectable medication, next injection date is 07/29/2022   Have you experienced any side effects in the last 30 days? No   Please enter the full address (street address, city, state, zip code) where you would like your medication(s) to be delivered to. [address], [place] [address]   Please specify on which day you would like your medication(s) to arrive. Note: if you need your medication(s) within 3 days, please call the pharmacy to schedule your order at [phone]  07/27/2022   Has your insurance changed since your last refill? No   Would you like a pharmacist to call you to discuss your medication(s)? No   Do you require a signature for your package? (Note: if we are billing Medicare Part B or your order contains a controlled substance, we will require a signature) No         Completed refill call assessment today to schedule patient's medication shipment from the [facility] [phone]).  All relevant notes have been reviewed.       Confirmed patient received a Conservation officer, historic buildings and a Surveyor, mining with first shipment. The patient will receive a drug information handout for each medication shipped and additional FDA Medication Guides as required.         REFERRAL TO PHARMACIST     Referral to the pharmacist: Not needed      [facility]     Shipping address confirmed in Epic.     Delivery Scheduled: Yes, Expected medication delivery date: 7/16.     Medication will be delivered via UPS to the temporary address in Epic WAM.    [name] Shared [facility] Specialty Technician

## 2022-07-27 MED FILL — DUPIXENT 300 MG/2 ML SUBCUTANEOUS PEN INJECTOR: SUBCUTANEOUS | 28 days supply | Qty: 4 | Fill #1

## 2022-08-14 NOTE — Unmapped (Signed)
[facility] Refill Coordination Note    Adrian Rosario, DOB: 1975-05-13  Phone: There are no phone numbers on file.      All above HIPAA information was verified with patient.         08/13/2022     5:16 PM   Specialty Rx Medication Refill Questionnaire   Which Medications would you like refilled and shipped? Dupixent   Please list all current allergies: None   Have you missed any doses in the last 30 days? No   Have you had any changes to your medication(s) since your last refill? No   How many days remaining of each medication do you have at home? 0   If receiving an injectable medication, next injection date is 08/26/2022   Have you experienced any side effects in the last 30 days? No   Please enter the full address (street address, city, state, zip code) where you would like your medication(s) to be delivered to. [address], [place] [address]   Please specify on which day you would like your medication(s) to arrive. Note: if you need your medication(s) within 3 days, please call the pharmacy to schedule your order at [phone]  08/24/2022   Has your insurance changed since your last refill? No   Would you like a pharmacist to call you to discuss your medication(s)? No   Do you require a signature for your package? (Note: if we are billing Medicare Part B or your order contains a controlled substance, we will require a signature) No         Completed refill call assessment today to schedule patient's medication shipment from the [facility] [phone]).  All relevant notes have been reviewed.       Confirmed patient received a Conservation officer, historic buildings and a Surveyor, mining with first shipment. The patient will receive a drug information handout for each medication shipped and additional FDA Medication Guides as required.         REFERRAL TO PHARMACIST     Referral to the pharmacist: Not needed      [facility]     Shipping address confirmed in Epic.     Delivery Scheduled: Yes, Expected medication delivery date: 8/13.     Medication will be delivered via UPS to the temporary address in Epic WAM.    Adrian Rosario Shared [facility] Specialty Technician

## 2022-08-24 MED FILL — DUPIXENT 300 MG/2 ML SUBCUTANEOUS PEN INJECTOR: SUBCUTANEOUS | 28 days supply | Qty: 4 | Fill #2

## 2022-08-26 ENCOUNTER — Telehealth: Payer: Self-pay | Admitting: Family Medicine

## 2022-08-26 DIAGNOSIS — I10 Essential (primary) hypertension: Secondary | ICD-10-CM

## 2022-08-26 NOTE — Telephone Encounter (Signed)
Prescription Request  08/26/2022  LOV: 02/09/2022  What is the name of the medication or equipment?   losartan (COZAAR) 50 MG tablet  **90 day script requested**  Have you contacted your pharmacy to request a refill? Yes   Which pharmacy would you like this sent to?  [id number] - [place], [place] - [address] [place] [address] Phone: [phone] Fax: [phone]    Patient notified that their request is being sent to the clinical staff for review and that they should receive a response within 2 business days.   Please advise pharmacist.

## 2022-08-27 MED ORDER — LOSARTAN POTASSIUM 50 MG PO TABS: 50.0000 mg | ORAL_TABLET | Freq: Every day | ORAL | 0 refills | Status: DC

## 2022-08-27 NOTE — Telephone Encounter (Signed)
OV needed for additional refills.  Requested Prescriptions  Pending Prescriptions Disp Refills   losartan (COZAAR) 50 MG tablet 90 tablet 0    Sig: Take 1 tablet (50 mg total) by mouth daily.     Cardiovascular:  Angiotensin Receptor Blockers Failed - 08/26/2022  9:21 AM      Failed - Cr in normal range and within 180 days    Creat  Date Value Ref Range Status  01/16/2022 0.93 0.60 - 1.29 mg/dL Final         Failed - K in normal range and within 180 days    Potassium  Date Value Ref Range Status  01/16/2022 4.3 3.5 - 5.3 mmol/L Final         Failed - Valid encounter within last 6 months    Recent Outpatient Visits           1 year ago Hemorrhoids, unspecified hemorrhoid type   [facility] Donita Brooks, MD   3 years ago Chronic midline low back pain without sciatica   [facility] Pickard, Priscille Heidelberg, MD   4 years ago Screen for STD (sexually transmitted disease)   [facility] Danelle Berry, PA-C   5 years ago Eczema, unspecified type   [facility] Danelle Berry, PA-C   7 years ago Asthma, mild persistent, uncomplicated   [facility] Pickard, Priscille Heidelberg, MD              Passed - Patient is not pregnant      Passed - Last BP in normal range    BP Readings from Last 1 Encounters:  02/09/22 138/80

## 2022-09-16 NOTE — Unmapped (Signed)
[facility] Refill Coordination Note    Adrian Rosario, DOB: January 12, 1976  Phone: There are no phone numbers on file.      All above HIPAA information was verified with patient.         09/15/2022     4:33 PM   Specialty Rx Medication Refill Questionnaire   Which Medications would you like refilled and shipped? Dupixent   Please list all current allergies: None   Have you missed any doses in the last 30 days? No   Have you had any changes to your medication(s) since your last refill? No   How many days remaining of each medication do you have at home? None   If receiving an injectable medication, next injection date is 09/23/2022   Have you experienced any side effects in the last 30 days? No   Please enter the full address (street address, city, state, zip code) where you would like your medication(s) to be delivered to. [address], [place] [address]   Please specify on which day you would like your medication(s) to arrive. Note: if you need your medication(s) within 3 days, please call the pharmacy to schedule your order at [phone]  09/21/2022   Has your insurance changed since your last refill? No   Would you like a pharmacist to call you to discuss your medication(s)? No   Do you require a signature for your package? (Note: if we are billing Medicare Part B or your order contains a controlled substance, we will require a signature) No         Completed refill call assessment today to schedule patient's medication shipment from the [facility] [phone]).  All relevant notes have been reviewed.       Confirmed patient received a Conservation officer, historic buildings and a Surveyor, mining with first shipment. The patient will receive a drug information handout for each medication shipped and additional FDA Medication Guides as required.         REFERRAL TO PHARMACIST     Referral to the pharmacist: Not needed      [facility]     Shipping address confirmed in Epic.     Delivery Scheduled: Yes, Expected medication delivery date: 9/10.   Pt ok w/date  Medication will be delivered via UPS to the temporary address in Epic WAM.    [name] Shared [facility] Specialty Technician

## 2022-09-21 MED FILL — DUPIXENT 300 MG/2 ML SUBCUTANEOUS PEN INJECTOR: SUBCUTANEOUS | 28 days supply | Qty: 4 | Fill #3

## 2022-09-22 ENCOUNTER — Other Ambulatory Visit: Payer: Self-pay | Admitting: Family Medicine

## 2022-09-23 NOTE — Telephone Encounter (Signed)
Requested Prescriptions  Pending Prescriptions Disp Refills   albuterol (VENTOLIN HFA) 108 (90 Base) MCG/ACT inhaler [Pharmacy Med Name: ALBUTEROL HFA (PROAIR) INHALER] 8 each 2    Sig: TAKE 2 PUFFS BY MOUTH EVERY 6 HOURS AS NEEDED FOR WHEEZE OR SHORTNESS OF BREATH     Pulmonology:  Beta Agonists 2 Failed - 09/22/2022  1:33 AM      Failed - Valid encounter within last 12 months    Recent Outpatient Visits           2 years ago Hemorrhoids, unspecified hemorrhoid type   [facility] Donita Brooks, MD   3 years ago Chronic midline low back pain without sciatica   [facility] Pickard, Priscille Heidelberg, MD   4 years ago Screen for STD (sexually transmitted disease)   [facility] Danelle Berry, PA-C   5 years ago Eczema, unspecified type   [facility] Danelle Berry, PA-C   7 years ago Asthma, mild persistent, uncomplicated   [facility] Pickard, Priscille Heidelberg, MD              Passed - Last BP in normal range    BP Readings from Last 1 Encounters:  02/09/22 138/80         Passed - Last Heart Rate in normal range    Pulse Readings from Last 1 Encounters:  02/09/22 75

## 2022-10-02 LAB — LAB REPORT - SCANNED: EGFR: 77

## 2022-10-12 NOTE — Unmapped (Signed)
[facility] Delivery Pharmacy Refill Coordination Note    Specialty Medication(s) to be Shipped:   Inflammatory Disorders: Dupixent    Other medication(s) to be shipped: No additional medications requested for fill at this time     Adrian Rosario, DOB: 10/03/1975  Phone: There are no phone numbers on file.      All above HIPAA information was verified with patient.     Was a Nurse, learning disability used for this call? No    Completed refill call assessment today to schedule patient's medication shipment from the [facility]  [phone]).  All relevant notes have been reviewed.     Specialty medication(s) and dose(s) confirmed: Regimen is correct and unchanged.   Changes to medications: Adrian Rosario reports no changes at this time.  Changes to insurance: No  New side effects reported not previously addressed with a pharmacist or physician: None reported  Questions for the pharmacist: No    Confirmed patient received a Conservation officer, historic buildings and a Surveyor, mining with first shipment. The patient will receive a drug information handout for each medication shipped and additional FDA Medication Guides as required.       DISEASE/MEDICATION-SPECIFIC INFORMATION        For patients on injectable medications: Patient currently has 0 doses left.  Next injection is scheduled for 10/09.    SPECIALTY MEDICATION ADHERENCE     Medication Adherence    Patient reported X missed doses in the last month: 0  Specialty Medication: DUPIXENT PEN 300 mg/2 mL Pnij (dupilumab)  Patient is on additional specialty medications: No              Were doses missed due to medication being on hold? No    DUPIXENT PEN 300 mg/2 mL Pnij (dupilumab)  : 9 days of medicine on hand       REFERRAL TO PHARMACIST     Referral to the pharmacist: Not needed      [facility]     Shipping address confirmed in Epic.       Delivery Scheduled: Yes, Expected medication delivery date: 10/04.     Medication will be delivered via UPS to the prescription address in Epic WAM.    [name] [facility] Delivery Pharmacy  Specialty Technician

## 2022-10-15 MED FILL — DUPIXENT 300 MG/2 ML SUBCUTANEOUS PEN INJECTOR: SUBCUTANEOUS | 28 days supply | Qty: 4 | Fill #4

## 2022-10-28 ENCOUNTER — Other Ambulatory Visit: Payer: Self-pay | Admitting: Family Medicine

## 2022-10-28 NOTE — Telephone Encounter (Signed)
Requested medication (s) are due for refill today: Yes  Requested medication (s) are on the active medication list: yes    Last refill: 02/09/22   #30  0 refills  Future visit scheduled No  Notes to clinic: Not delegated, please review. Thank you.  Requested Prescriptions  Pending Prescriptions Disp Refills   ALPRAZolam (XANAX) 1 MG tablet [Pharmacy Med Name: ALPRAZOLAM 1 MG TABLET] 30 tablet     Sig: TAKE 1 TABLET BY MOUTH 3 TIMES DAILY AS NEEDED FOR ANXIETY.     Not Delegated - Psychiatry: Anxiolytics/Hypnotics 2 Failed - 10/28/2022  7:20 AM      Failed - This refill cannot be delegated      Failed - Urine Drug Screen completed in last 360 days      Failed - Valid encounter within last 6 months    Recent Outpatient Visits           2 years ago Hemorrhoids, unspecified hemorrhoid type   [facility] Donita Brooks, MD   3 years ago Chronic midline low back pain without sciatica   [facility] Pickard, Priscille Heidelberg, MD   4 years ago Screen for STD (sexually transmitted disease)   [facility] Danelle Berry, PA-C   5 years ago Eczema, unspecified type   [facility] Danelle Berry, PA-C   7 years ago Asthma, mild persistent, uncomplicated   [facility] Pickard, Priscille Heidelberg, MD              Passed - Patient is not pregnant

## 2022-11-10 ENCOUNTER — Encounter: Payer: Self-pay | Admitting: Family Medicine

## 2022-11-10 ENCOUNTER — Ambulatory Visit (INDEPENDENT_AMBULATORY_CARE_PROVIDER_SITE_OTHER): Payer: BC Managed Care – PPO | Admitting: Family Medicine

## 2022-11-10 ENCOUNTER — Other Ambulatory Visit: Payer: Self-pay | Admitting: Family Medicine

## 2022-11-10 VITALS — BP 130/88 | HR 67 | Temp 98.8°F | Ht 67.0 in | Wt 189.4 lb

## 2022-11-10 DIAGNOSIS — J4530 Mild persistent asthma, uncomplicated: Secondary | ICD-10-CM | POA: Diagnosis not present

## 2022-11-10 DIAGNOSIS — I10 Essential (primary) hypertension: Secondary | ICD-10-CM

## 2022-11-10 DIAGNOSIS — F411 Generalized anxiety disorder: Secondary | ICD-10-CM | POA: Diagnosis not present

## 2022-11-10 DIAGNOSIS — J3089 Other allergic rhinitis: Secondary | ICD-10-CM | POA: Diagnosis not present

## 2022-11-10 DIAGNOSIS — Z23 Encounter for immunization: Secondary | ICD-10-CM | POA: Diagnosis not present

## 2022-11-10 DIAGNOSIS — J301 Allergic rhinitis due to pollen: Secondary | ICD-10-CM | POA: Diagnosis not present

## 2022-11-10 DIAGNOSIS — R5383 Other fatigue: Secondary | ICD-10-CM | POA: Diagnosis not present

## 2022-11-10 DIAGNOSIS — J3081 Allergic rhinitis due to animal (cat) (dog) hair and dander: Secondary | ICD-10-CM | POA: Diagnosis not present

## 2022-11-10 MED ORDER — DUPIXENT 300 MG/2 ML SUBCUTANEOUS PEN INJECTOR
SUBCUTANEOUS | 5 refills | 28 days
Start: 2022-11-10 — End: ?

## 2022-11-10 MED ORDER — ESCITALOPRAM OXALATE 10 MG PO TABS: 10.0000 mg | ORAL_TABLET | Freq: Every day | ORAL | 3 refills | Status: DC

## 2022-11-10 MED ORDER — LOSARTAN POTASSIUM 50 MG PO TABS: 50.0000 mg | ORAL_TABLET | Freq: Every day | ORAL | 3 refills | Status: DC

## 2022-11-10 MED ORDER — ALPRAZOLAM 1 MG PO TABS: 1.0000 mg | ORAL_TABLET | Freq: Three times a day (TID) | ORAL | 0 refills | Status: DC | PRN

## 2022-11-10 NOTE — Unmapped (Signed)
[facility] Delivery Pharmacy Refill Coordination Note    Specialty Medication(s) to be Shipped:   Inflammatory Disorders: Dupixent    Other medication(s) to be shipped: No additional medications requested for fill at this time     Adrian Rosario, DOB: 1975/04/26  Phone: There are no phone numbers on file.      All above HIPAA information was verified with patient.     Was a Nurse, learning disability used for this call? No    Completed refill call assessment today to schedule patient's medication shipment from the [facility]  [phone]).  All relevant notes have been reviewed.     Specialty medication(s) and dose(s) confirmed: Regimen is correct and unchanged.   Changes to medications: Javel reports no changes at this time.  Changes to insurance: No  New side effects reported not previously addressed with a pharmacist or physician: None reported  Questions for the pharmacist: No    Confirmed patient received a Conservation officer, historic buildings and a Surveyor, mining with first shipment. The patient will receive a drug information handout for each medication shipped and additional FDA Medication Guides as required.       DISEASE/MEDICATION-SPECIFIC INFORMATION        For patients on injectable medications: Patient currently has 1 doses left.  Next injection is scheduled for 11/11/22 *pt thinks he was at work and unsure if he has one for tomorrow or not.    SPECIALTY MEDICATION ADHERENCE     Medication Adherence    Patient reported X missed doses in the last month: 0  Specialty Medication: DUPIXENT PEN 300 mg/2 mL Pnij (dupilumab)  Patient is on additional specialty medications: No  Informant: patient              Were doses missed due to medication being on hold? No   Dupixent 300 mg/ml: 14 days of medicine on hand       REFERRAL TO PHARMACIST     Referral to the pharmacist: Not needed      [facility]     Shipping address confirmed in Epic.       Delivery Scheduled: Yes, Expected medication delivery date: 11/13/22.     Medication will be delivered via UPS to the prescription address in Epic WAM.    Sherral Hammers, PharmD   [facility] Specialty and Home Delivery Pharmacy  Specialty Pharmacist

## 2022-11-10 NOTE — Progress Notes (Signed)
Subjective:    Patient ID: Andres Jennings, male    DOB: [birth year], 47 y.o.   MRN: [id number]  HPI 01/16/22 Patient has a history of anxiety.  He was recently traveling over Christmas.  They were in [facility].  He had some type of upper respiratory tract infection prior to flying.  He did not feel well and was taking cold medication.  He also had two beers prior to his plane flight.  Once on the plane, he fell asleep.  When he woke up he felt extremely weak and tired and sleepy.  He did not feel well.  He remembers feeling anxious at the moment he woke up about how poorly he felt.  Within a few seconds he lost consciousness.  He had a witnessed generalized seizure that lasted 30 seconds and resolved spontaneously.  There was no bowel or bladder incontinence.  He did not bite his tongue.  There was no postictal phase.  He denies any headache or neurologic deficits.  Today his neurologic exam is normal.  He regained consciousness within 30 seconds.  He was immediately alert and able to answer questions appropriately with no obvious patient or neurologic deficits.  He is here today for follow-up.  At that time, my plan was: I believe that this was a vasovagal reaction.  I believe his blood pressure was low due to his viral illness.  I believe the alcohol likely lowered his blood pressure coupled with his blood pressure medication.  I then believe he had a panic attack when he woke up and had a vasovagal reaction.  I believe this is what he reports when he states that he did not feel well and he felt lightheaded.  I believe that this triggered a vasovagal reaction and due to hypotension he lost consciousness and then suffered a seizure afterwards due to decreased cerebral perfusion from hypotension.  I believe that he immediately regained consciousness with no postictal phase which would be unlikely.  Therefore, I believe it is unlikely that he will have another seizure and does not require antiseizure medication  at this time.  I will consult neurology for consideration of any EEG and neuroimaging.  I will defer to their recommendations regarding the neuroimaging.  I will check a CBC and a CMP to evaluate for any electrolyte disturbances that may have triggered his seizure.  02/09/22 Patient has met with neurology who agrees that this was likely a vasovagal reaction.  He would like to try to treat his anxiety better.  He has been on sertraline for 20 years at a very low dose.  Initially saw benefit with depression.  However now he feels that the medication has little effect.  He denies any depression but he does report feeling anxious and having occasional panic attacks.  He tends to have anxiety stemming from concerns about his health care.  If he feels "off", his anxiety can rapidly spin out of control. 11/10/22 Patient states that he is doing much better on the Lexapro.  He denies feeling anxious during his regular day-to-day.  He still gets anxious when he is flying.  He would like Xanax to take occasionally when flying.  30 pills have lasted him almost 11 months.  He takes it very sporadically.  He would like to continue Lexapro.  He feels like it is beneficial.  His blood pressure today is excellent.  Since I last saw the patient, he has gone to a clinic called [facility].  He states  he was diagnosed with low testosterone.  He started on 560 mg of testosterone IM every week.  His most recent lab work includes a hematocrit of 53 and LDL cholesterol of 111, a testosterone level of 1243, a free testosterone level of 396, and elevated insulin like growth factor of 269.  The remainder of his lab work was normal.  He states that they were having him take tamoxifen as well another medication along with the testosterone.  He is questioning if I can refill this for him.  He states that his testosterone level was low prior to starting the testosterone but he cannot recall the level.  His PSA was 1.8.  He states that his  previous PSA was 1.3.  He feels much better since starting the testosterone. Past Medical History:  Diagnosis Date   Anxiety    Asthma    Depression    Eczema    No past surgical history on file. Current Outpatient Medications on File Prior to Visit  Medication Sig Dispense Refill   albuterol (VENTOLIN HFA) 108 (90 Base) MCG/ACT inhaler TAKE 2 PUFFS BY MOUTH EVERY 6 HOURS AS NEEDED FOR WHEEZE OR SHORTNESS OF BREATH 8 each 2   ALPRAZolam (XANAX) 1 MG tablet Take 1 tablet (1 mg total) by mouth 3 (three) times daily as needed for anxiety. 30 tablet 0   budesonide-formoterol (SYMBICORT) 80-4.5 MCG/ACT inhaler Inhale 2 puffs into the lungs 2 (two) times daily. 20.4 Inhaler 1   Dupilumab (DUPIXENT Moody) Inject into the skin.     escitalopram (LEXAPRO) 10 MG tablet TAKE 1 TABLET BY MOUTH EVERY DAY 90 tablet 2   losartan (COZAAR) 50 MG tablet Take 1 tablet (50 mg total) by mouth daily. 90 tablet 0   No current facility-administered medications on file prior to visit.   No Known Allergies Social History   Socioeconomic History   Marital status: Married    Spouse name: Not on file   Number of children: Not on file   Years of education: Not on file   Highest education level: Not on file  Occupational History   Not on file  Tobacco Use   Smoking status: Never   Smokeless tobacco: Never  Vaping Use   Vaping status: Never Used  Substance and Sexual Activity   Alcohol use: Yes    Comment: Occasional   Drug use: No   Sexual activity: Not on file  Other Topics Concern   Not on file  Social History Narrative   Are you right handed or left handed? Right    Are you currently employed ? yes   What is your current occupation? [employer]    Do you live at home alone? no   Who lives with you? Wife 3 kids   What type of home do you live in: 1 story or 2 story? 3 story        Social Determinants of Corporate investment banker Strain: Not on file  Food Insecurity: Not on file   Transportation Needs: Not on file  Physical Activity: Not on file  Stress: Not on file  Social Connections: Not on file  Intimate Partner Violence: Not on file     Review of Systems  All other systems reviewed and are negative.      Objective:   Physical Exam Vitals reviewed.  Constitutional:      General: He is not in acute distress.    Appearance: Normal appearance. He is not ill-appearing, toxic-appearing  or diaphoretic.  Cardiovascular:     Rate and Rhythm: Normal rate and regular rhythm.  Pulmonary:     Effort: Pulmonary effort is normal.     Breath sounds: Normal breath sounds.  Neurological:     General: No focal deficit present.     Mental Status: He is alert and oriented to person, place, and time. Mental status is at baseline.     Cranial Nerves: No cranial nerve deficit.     Sensory: No sensory deficit.     Motor: No weakness.     Coordination: Coordination normal.     Gait: Gait normal.      .     Assessment & Plan:  GAD (generalized anxiety disorder)  Essential hypertension  Fatigue, unspecified type I checked the patient's testosterone level last year in July.  At that time his total, free, and bioavailable testosterone levels were all normal albeit at the lower end.  He states that a repeat level was low.  I definitely feel that he is taking an excessive amount of testosterone now.  I have recommended that he reduce his testosterone use to 100 mg weekly as a maximum and recheck levels in 3 months.  If it is still elevated at that time I would recommend discontinuing testosterone but I will defer this to the relationship he has with the clinic he is seeing.  Patient ask if I can continue to prescribe the medication.  I will be glad to but I will need documentation confirming that he has a low testosterone level first.  I did refill his Lexapro as this seems to be beneficial to the patient.  I will refill his Xanax that he uses sparingly.  His blood  pressure today is excellent.

## 2022-11-12 MED FILL — DUPIXENT 300 MG/2 ML SUBCUTANEOUS PEN INJECTOR: SUBCUTANEOUS | 28 days supply | Qty: 4 | Fill #5

## 2022-11-30 MED ORDER — DUPIXENT 300 MG/2 ML SUBCUTANEOUS PEN INJECTOR
SUBCUTANEOUS | 11 refills | 28 days
Start: 2022-11-30 — End: ?

## 2022-12-14 NOTE — Unmapped (Signed)
The [facility] has made a third and final attempt to reach this patient to refill the following medication:Dupixent.      We have left voicemails on the following phone numbers: [phone] .    Dates contacted: 12/02/22 12/07/22 12/14/22  Last scheduled delivery: 11/11/22 (28 day supply)    The patient may be at risk of non-compliance with this medication. The patient should call the [facility] at [phone]  Option 4, then Option 2: Dermatology, Gastroenterology, Rheumatology to refill medication.    Sherral Hammers, PharmD   [facility] Specialty Pharmacist

## 2022-12-14 NOTE — Unmapped (Signed)
[facility] Clinical Assessment & Refill Coordination Note    Patient reports doing well on the medication and did not have any questions or concerns at this time.     Adrian Rosario, DOB: 01-Jan-1976  Phone: There are no phone numbers on file.    All above HIPAA information was verified with patient.     Was a Nurse, learning disability used for this call? No    Specialty Medication(s):   Inflammatory Disorders: Dupixent     Current Outpatient Medications   Medication Sig Dispense Refill    dupilumab (DUPIXENT PEN) 300 mg/2 mL PnIj Inject the contents of 1 pen (300 mg total) under the skin every fourteen (14) days. 4 mL 5    dupilumab (DUPIXENT PEN) 300 mg/2 mL PnIj Inject the contents of 1 pen (300 mg total) under the skin every fourteen (14) days. 4 mL 5    dupilumab (DUPIXENT PEN) 300 mg/2 mL PnIj Inject the contents of 1 pen (300 mg) under the skin every fourteen (14) days. 4 mL 5    dupilumab (DUPIXENT PEN) 300 mg/2 mL PnIj Inject the contents of 1 pen (300 mg) under the skin every fourteen (14) days. 4 mL 11     No current facility-administered medications for this visit.        Changes to medications: [name] reports no changes at this time.    No Known Allergies    Changes to allergies: No    SPECIALTY MEDICATION ADHERENCE     Dupixent 300 mg/ml: 0 doses of medicine on hand       Medication Adherence    Patient reported X missed doses in the last month: 0  Specialty Medication: dupilumab (DUPIXENT PEN) 300 mg/2 mL PnIj  Patient is on additional specialty medications: No  Patient is on more than two specialty medications: No  Informant: patient          Specialty medication(s) dose(s) confirmed: Regimen is correct and unchanged.     Are there any concerns with adherence? No    Adherence counseling provided? Not needed    CLINICAL MANAGEMENT AND INTERVENTION      Clinical Benefit Assessment:    Do you feel the medicine is effective or helping your condition? Patient declined to answer    Clinical Benefit counseling provided? Not needed    Adverse Effects Assessment:    Are you experiencing any side effects? No    Are you experiencing difficulty administering your medicine? No    Quality of Life Assessment:    Quality of Life    Rheumatology  Oncology  Dermatology  1. What impact has your specialty medication had on the symptoms of your skin condition (i.e. itchiness, soreness, stinging)?: Tremendous  2. What impact has your specialty medication had on your comfort level with your skin?: Tremendous  Cystic Fibrosis          How many days over the past month did your AD  keep you from your normal activities? For example, brushing your teeth or getting up in the morning. 0    Have you discussed this with your provider? Not needed    Acute Infection Status:    Acute infections noted within Epic:  No active infections  Patient reported infection: None    Therapy Appropriateness:    Is therapy appropriate based on current medication list, adverse reactions, adherence, clinical benefit and progress toward achieving therapeutic goals? Yes, therapy is appropriate and should be continued  DISEASE/MEDICATION-SPECIFIC INFORMATION      For patients on injectable medications: Patient currently has 0 doses left.  Next injection is scheduled for 12/16/22.    Chronic Inflammatory Diseases: Have you experienced any flares in the last month? No    PATIENT SPECIFIC NEEDS     Does the patient have any physical, cognitive, or cultural barriers? No    Is the patient high risk? No    Did the patient require a clinical intervention? No    Does the patient require physician intervention or other additional services (i.e., nutrition, smoking cessation, social work)? No    SOCIAL DETERMINANTS OF HEALTH     At the [facility], we have learned that life circumstances - like trouble affording food, housing, utilities, or transportation can affect the health of many of our patients.   That is why we wanted to ask: are you currently experiencing any life circumstances that are negatively impacting your health and/or quality of life? Patient declined to answer    Social Drivers of Health     Food Insecurity: Not on file   Internet Connectivity: Not on file   Housing/Utilities: Not on file   Tobacco Use: Not on file   Transportation Needs: Not on file   Alcohol Use: Not on file   Interpersonal Safety: Not on file   Physical Activity: Not on file   Intimate Partner Violence: Not on file   Stress: Not on file   Substance Use: Not on file (12/14/2022)   Social Connections: Not on file   Financial Resource Strain: Not on file   Depression: Not on file   Health Literacy: Not on file       Would you be willing to receive help with any of the needs that you have identified today? Not applicable       SHIPPING     Specialty Medication(s) to be Shipped:   Inflammatory Disorders: Dupixent    Other medication(s) to be shipped: No additional medications requested for fill at this time     Changes to insurance: No    Delivery Scheduled: Yes, Expected medication delivery date: 12/16/22.     Medication will be delivered via UPS to the confirmed prescription address in  [facility].    The patient will receive a drug information handout for each medication shipped and additional FDA Medication Guides as required.  Verified that patient has previously received a Conservation officer, historic buildings and a Surveyor, mining.    The patient or caregiver noted above participated in the development of this care plan and knows that they can request review of or adjustments to the care plan at any time.      All of the patient's questions and concerns have been addressed.    Sherral Hammers, PharmD   [facility] Specialty Pharmacist

## 2022-12-15 MED FILL — DUPIXENT 300 MG/2 ML SUBCUTANEOUS PEN INJECTOR: SUBCUTANEOUS | 28 days supply | Qty: 4 | Fill #0

## 2023-01-08 NOTE — Unmapped (Signed)
[facility] Refill Coordination Note    Abner Sperandio, DOB: 30-Apr-1975  Phone: There are no phone numbers on file.      All above HIPAA information was verified with patient.         01/07/2023     8:52 AM   Specialty Rx Medication Refill Questionnaire   Which Medications would you like refilled and shipped? Dupixent   Please list all current allergies: None   Have you missed any doses in the last 30 days? No   Have you had any changes to your medication(s) since your last refill? No   How many days remaining of each medication do you have at home? 0   If receiving an injectable medication, next injection date is 01/19/2026   Have you experienced any side effects in the last 30 days? No   Please enter the full address (street address, city, state, zip code) where you would like your medication(s) to be delivered to. [phone], [place] [address]   Please specify on which day you would like your medication(s) to arrive. Note: if you need your medication(s) within 3 days, please call the pharmacy to schedule your order at [phone]  01/17/2023   Has your insurance changed since your last refill? No   Would you like a pharmacist to call you to discuss your medication(s)? No   Do you require a signature for your package? (Note: if we are billing Medicare Part B or your order contains a controlled substance, we will require a signature) No         Completed refill call assessment today to schedule patient's medication shipment from the [facility] [phone]).  All relevant notes have been reviewed.       Confirmed patient received a Conservation officer, historic buildings and a Surveyor, mining with first shipment. The patient will receive a drug information handout for each medication shipped and additional FDA Medication Guides as required.         REFERRAL TO PHARMACIST     Referral to the pharmacist: Not needed      [facility]     Shipping address confirmed in Epic.     Delivery Scheduled: Yes, Expected medication delivery date: 01/19/23.   Pt ok w/date   Medication will be delivered via UPS to the prescription address in Epic [facility].    [name] Specialty Technician

## 2023-01-18 MED FILL — DUPIXENT 300 MG/2 ML SUBCUTANEOUS PEN INJECTOR: SUBCUTANEOUS | 28 days supply | Qty: 4 | Fill #1

## 2023-02-15 NOTE — Unmapped (Signed)
[facility] Delivery Pharm[facility] Refill Coordination Note    Specialty Medication(s) to be Shipped:   Inflammatory Disorders: Dupixent    Other medication(s) to be shipped: No additional medications requested for fill at this time     Adrian Rosario, DOB: 09-Jun-1975  Phone: There are no phone numbers on file.      All above HIPAA information was verified with patient.     Was a Nurse, learning disability used for this call? No    Completed refill call assessment today to schedule patient's medication shipment from the [facility]  [phone]).  All relevant notes have been reviewed.     Specialty medication(s) and dose(s) confirmed: Regimen is correct and unchanged.   Changes to medications: [name] reports no changes at this time.  Changes to insurance: No  New side effects reported not previously addressed with a pharmacist or physician: None reported  Questions for the pharmacist: No    Confirmed patient received a Conservation officer, historic buildings and a Surveyor, mining with first shipment. The patient will receive a drug information handout for each medication shipped and additional FDA Medication Guides as required.       DISEASE/MEDICATION-SPECIFIC INFORMATION        For patients on injectable medications: Patient currently has 0 doses left.  Next injection is scheduled for 02/24/23.    SPECIALTY MEDICATION ADHERENCE     Medication Adherence    Patient reported X missed doses in the last month: 0  Specialty Medication: DUPIXENT PEN 300 mg/2 mL Pnij (dupilumab)  Patient is on additional specialty medications: No  Patient is on more than two specialty medications: No  Informant: patient              Were doses missed due to medication being on hold? No    Dupixent 300 mg/ml: 0 doses of medicine on hand       REFERRAL TO PHARMACIST     Referral to the pharmacist: Not needed      [facility]     Shipping address confirmed in Epic.       Delivery Scheduled: Yes, Expected medication delivery date: 02/23/23. Medication will be delivered via UPS to the prescription address in Epic [facility].    Sherral Hammers, PharmD   [facility]  Specialty Pharmacist

## 2023-02-22 MED FILL — DUPIXENT 300 MG/2 ML SUBCUTANEOUS PEN INJECTOR: SUBCUTANEOUS | 28 days supply | Qty: 4 | Fill #2

## 2023-03-15 NOTE — Unmapped (Signed)
 [facility] Refill Coordination Note    Adrian Rosario, DOB: 10-Nov-1975  Phone: There are no phone numbers on file.      All above HIPAA information was verified with patient.         03/14/2023     1:34 PM   Specialty Rx Medication Refill Questionnaire   Which Medications would you like refilled and shipped? Dupixent   Please list all current allergies: None   Have you missed any doses in the last 30 days? No   Have you had any changes to your medication(s) since your last refill? No   How many days remaining of each medication do you have at home? 0   If receiving an injectable medication, next injection date is 03/24/2023   Have you experienced any side effects in the last 30 days? No   Please enter the full address (street address, city, state, zip code) where you would like your medication(s) to be delivered to. Adrian Rosario , [phone], [place] [address]   Please specify on which day you would like your medication(s) to arrive. Note: if you need your medication(s) within 3 days, please call the pharmacy to schedule your order at [phone]  03/22/2023   Has your insurance changed since your last refill? No   Would you like a pharmacist to call you to discuss your medication(s)? No   Do you require a signature for your package? (Note: if we are billing Medicare Part B or your order contains a controlled substance, we will require a signature) No         Completed refill call assessment today to schedule patient's medication shipment from the [facility] [phone]).  All relevant notes have been reviewed.       Confirmed patient received a Conservation officer, historic buildings and a Surveyor, mining with first shipment. The patient will receive a drug information handout for each medication shipped and additional FDA Medication Guides as required.         REFERRAL TO PHARMACIST     Referral to the pharmacist: Not needed      [facility]     Shipping address confirmed in Epic.     Delivery Scheduled: Yes, Expected medication delivery date: 3/11.   Ok w/date  Medication will be delivered via UPS to the prescription address in Epic [facility].    [name] Specialty Technician

## 2023-03-22 MED FILL — DUPIXENT 300 MG/2 ML SUBCUTANEOUS PEN INJECTOR: SUBCUTANEOUS | 28 days supply | Qty: 4 | Fill #3

## 2023-03-24 LAB — LAB REPORT - SCANNED
EGFR (Non-African Amer.): 80
TSH: 2.19 (ref 0.41–5.90)

## 2023-04-05 ENCOUNTER — Encounter: Payer: Self-pay | Admitting: Family Medicine

## 2023-04-05 ENCOUNTER — Ambulatory Visit (INDEPENDENT_AMBULATORY_CARE_PROVIDER_SITE_OTHER): Admitting: Family Medicine

## 2023-04-05 VITALS — BP 136/82 | HR 88 | Temp 98.5°F | Ht 67.0 in | Wt 191.0 lb

## 2023-04-05 DIAGNOSIS — E7800 Pure hypercholesterolemia, unspecified: Secondary | ICD-10-CM

## 2023-04-05 DIAGNOSIS — E291 Testicular hypofunction: Secondary | ICD-10-CM

## 2023-04-05 NOTE — Progress Notes (Signed)
 Subjective:    Patient ID: Andres Jennings, male    DOB: [birth year], 48 y.o.   MRN: [id number]  HPI Patient has a history of hypogonadism.  Patient was previously taking 200 mg of testosterone weekly under the care of another physician.  Testosterone levels were greater than 1000.  Therefore I recommended that he decrease his testosterone level to 100 mg from 280.  Single implant hemoglobin was 18.6.  Testosterone level was elevated at 795.  Free testosterone was 200.8.  TSH was normal.  Estradiol level was normal.  PSA rose slightly from 1.8-2.0.  The remainder of his lab work was normal except his total cholesterol was 200 and his LDL cholesterol was 123.  Patient does have a family history of premature cardiovascular disease.  His father had a heart attack in his 6s and also had peripheral artery disease in the early age. Past Medical History:  Diagnosis Date   Anxiety    Asthma    Depression    Eczema    No past surgical history on file. Current Outpatient Medications on File Prior to Visit  Medication Sig Dispense Refill   albuterol (VENTOLIN HFA) 108 (90 Base) MCG/ACT inhaler TAKE 2 PUFFS BY MOUTH EVERY 6 HOURS AS NEEDED FOR WHEEZE OR SHORTNESS OF BREATH 8 each 2   ALPRAZolam (XANAX) 1 MG tablet Take 1 tablet (1 mg total) by mouth 3 (three) times daily as needed for anxiety. 30 tablet 0   budesonide-formoterol (SYMBICORT) 80-4.5 MCG/ACT inhaler Inhale 2 puffs into the lungs 2 (two) times daily. 20.4 Inhaler 1   Dupilumab (DUPIXENT [place]) Inject into the skin.     escitalopram (LEXAPRO) 10 MG tablet Take 1 tablet (10 mg total) by mouth daily. 90 tablet 3   losartan (COZAAR) 50 MG tablet Take 1 tablet (50 mg total) by mouth daily. 90 tablet 3   No current facility-administered medications on file prior to visit.   No Known Allergies Social History   Socioeconomic History   Marital status: Married    Spouse name: Not on file   Number of children: Not on file   Years of education: Not  on file   Highest education level: Not on file  Occupational History   Not on file  Tobacco Use   Smoking status: Never   Smokeless tobacco: Never  Vaping Use   Vaping status: Never Used  Substance and Sexual Activity   Alcohol use: Yes    Comment: Occasional   Drug use: No   Sexual activity: Not on file  Other Topics Concern   Not on file  Social History Narrative   Are you right handed or left handed? Right    Are you currently employed ? yes   What is your current occupation? [employer]    Do you live at home alone? no   Who lives with you? Wife 3 kids   What type of home do you live in: 1 story or 2 story? 3 story        Social Drivers of Corporate investment banker Strain: Not on file  Food Insecurity: Not on file  Transportation Needs: Not on file  Physical Activity: Not on file  Stress: Not on file  Social Connections: Not on file  Intimate Partner Violence: Not on file     Review of Systems  All other systems reviewed and are negative.      Objective:   Physical Exam Vitals reviewed.  Constitutional:  General: He is not in acute distress.    Appearance: Normal appearance. He is not ill-appearing, toxic-appearing or diaphoretic.  Cardiovascular:     Rate and Rhythm: Normal rate and regular rhythm.  Pulmonary:     Effort: Pulmonary effort is normal.     Breath sounds: Normal breath sounds.  Neurological:     General: No focal deficit present.     Mental Status: He is alert and oriented to person, place, and time. Mental status is at baseline.     Cranial Nerves: No cranial nerve deficit.     Sensory: No sensory deficit.     Motor: No weakness.     Coordination: Coordination normal.     Gait: Gait normal.      .     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  I have recommended weaning off testosterone  altogether.  Decrease to 50 mg intramuscular every week for 1 month and then stop altogether.  See if patient notices a tremendous change.  I am concerned due to the slight rise in his PSA as well as his elevated hemoglobin.  I believe that the risk outweighed the benefit unless the patient noticed a dramatic decrease in his energy level off of testosterone.  I am concerned about his cholesterol given his family history.  Recommended a coronary artery calcium score and if elevated would recommend aggressive treatment of cholesterol.

## 2023-04-15 ENCOUNTER — Ambulatory Visit (HOSPITAL_COMMUNITY)
Admission: RE | Admit: 2023-04-15 | Discharge: 2023-04-15 | Disposition: A | Discharge status: Home / Self Care | Payer: Self-pay | Source: Ambulatory Visit | Attending: Family Medicine | Admitting: Family Medicine

## 2023-04-15 DIAGNOSIS — E7800 Pure hypercholesterolemia, unspecified: Secondary | ICD-10-CM | POA: Insufficient documentation

## 2023-04-22 NOTE — Unmapped (Signed)
 [facility] Specialty and Home Delivery Pharmacy Refill Coordination Note    Specialty Medication(s) to be Shipped:   Inflammatory Disorders: Dupixent    Other medication(s) to be shipped: No additional medications requested for fill at this time     Adrian Rosario, DOB: [birth year]  Phone: There are no phone numbers on file.      All above HIPAA information was verified with patient.     Was a Nurse, learning disability used for this call? No    Completed refill call assessment today to schedule patient's medication shipment from the [facility] and Home Delivery Pharmacy  [phone]).  All relevant notes have been reviewed.     Specialty medication(s) and dose(s) confirmed: Regimen is correct and unchanged.   Changes to medications: [place] reports no changes at this time.  Changes to insurance: No  New side effects reported not previously addressed with a pharmacist or physician: None reported  Questions for the pharmacist: No    Confirmed patient received a Conservation officer, historic buildings and a Surveyor, mining with first shipment. The patient will receive a drug information handout for each medication shipped and additional FDA Medication Guides as required.       DISEASE/MEDICATION-SPECIFIC INFORMATION        For patients on injectable medications: Patient currently has 0 doses left.  Next injection is scheduled for 04/28/23.    SPECIALTY MEDICATION ADHERENCE     Medication Adherence    Patient reported X missed doses in the last month: 0  Specialty Medication: DUPIXENT PEN 300 mg/2 mL Pnij (dupilumab)  Patient is on additional specialty medications: No  Patient is on more than two specialty medications: No  Informant: patient              Were doses missed due to medication being on hold? No    Dupixent 300 mg/ml: 0 doses of medicine on hand     REFERRAL TO PHARMACIST     Referral to the pharmacist: Not needed      [facility]     Shipping address confirmed in Epic.     Cost and Payment: Patient has a $0 copay, payment information is not required.    Delivery Scheduled: Yes, Expected medication delivery date: 04/27/23.     Medication will be delivered via UPS to the prescription address in Epic WAM.    Sherral Hammers, PharmD   [facility] Specialty and Home Delivery Pharmacy  Specialty Pharmacist

## 2023-04-26 MED FILL — DUPIXENT 300 MG/2 ML SUBCUTANEOUS PEN INJECTOR: SUBCUTANEOUS | 28 days supply | Qty: 4 | Fill #4

## 2023-05-17 NOTE — Unmapped (Signed)
 [facility] Specialty and Home Delivery Pharmacy Refill Coordination Note    Adrian Rosario, DOB: November 08, 1975  Phone: There are no phone numbers on file.      All above HIPAA information was verified with patient.         05/17/2023    11:25 AM   Specialty Rx Medication Refill Questionnaire   Which Medications would you like refilled and shipped? Dupixent    Please list all current allergies: None   Have you missed any doses in the last 30 days? No   Have you had any changes to your medication(s) since your last refill? No   How many days remaining of each medication do you have at home? 0   If receiving an injectable medication, next injection date is 05/26/2023   Have you experienced any side effects in the last 30 days? No   Please enter the full address (street address, city, state, zip code) where you would like your medication(s) to be delivered to. [address], [place] [address]   Please specify on which day you would like your medication(s) to arrive. Note: if you need your medication(s) within 3 days, please call the pharmacy to schedule your order at [phone]  05/24/2023   Has your insurance changed since your last refill? No   Would you like a pharmacist to call you to discuss your medication(s)? No   Do you require a signature for your package? (Note: if we are billing Medicare Part B or your order contains a controlled substance, we will require a signature) No   I have been provided my out of pocket cost for my medication and approve the pharmacy to charge the amount to my credit card on file. Yes         Completed refill call assessment today to schedule patient's medication shipment from the [facility] and Home Delivery Pharmacy [phone]).  All relevant notes have been reviewed.       Confirmed patient received a Conservation officer, historic buildings and a Surveyor, mining with first shipment. The patient will receive a drug information handout for each medication shipped and additional FDA Medication Guides as required.         REFERRAL TO PHARMACIST     Referral to the pharmacist: Not needed      [facility]     Shipping address confirmed in Epic.     Delivery Scheduled: Yes, Expected medication delivery date: 5/13.     Medication will be delivered via UPS to the prescription address in Epic WAM.    [facility] Specialty and Home Delivery Pharmacy Specialty Technician

## 2023-05-24 MED FILL — DUPIXENT 300 MG/2 ML SUBCUTANEOUS PEN INJECTOR: SUBCUTANEOUS | 28 days supply | Qty: 4 | Fill #5

## 2023-06-21 NOTE — Unmapped (Signed)
 [facility] Specialty and Home Delivery Pharmacy Refill Coordination Note    Adrian Rosario, DOB: [birth year]  Phone: There are no phone numbers on file.      All above HIPAA information was verified with patient.         06/18/2023     4:53 PM   Specialty Rx Medication Refill Questionnaire   Which Medications would you like refilled and shipped? Dupixent    Please list all current allergies: None   Have you missed any doses in the last 30 days? No   Have you had any changes to your medication(s) since your last refill? No   How many days remaining of each medication do you have at home? 0   If receiving an injectable medication, next injection date is 06/23/2023   Have you experienced any side effects in the last 30 days? No   Please enter the full address (street address, city, state, zip code) where you would like your medication(s) to be delivered to. [address], [place]  [address]   Please specify on which day you would like your medication(s) to arrive. Note: if you need your medication(s) within 3 days, please call the pharmacy to schedule your order at [phone]  06/22/2023   Has your insurance changed since your last refill? No   Would you like a pharmacist to call you to discuss your medication(s)? No   Do you require a signature for your package? (Note: if we are billing Medicare Part B or your order contains a controlled substance, we will require a signature) No   I have been provided my out of pocket cost for my medication and approve the pharmacy to charge the amount to my credit card on file. No   Additional Information Confirm         Completed refill call assessment today to schedule patient's medication shipment from the [facility] Specialty and Home Delivery Pharmacy [phone]).  All relevant notes have been reviewed.       Confirmed patient received a Conservation officer, historic buildings and a Surveyor, mining with first shipment. The patient will receive a drug information handout for each medication shipped and additional FDA Medication Guides as required.         REFERRAL TO PHARMACIST     Referral to the pharmacist: Not needed      [facility]     Shipping address confirmed in Epic.     Delivery Scheduled: Yes, Expected medication delivery date: 06/11.     Medication will be delivered via UPS to the prescription address in Epic WAM.    [facility] Special[facility] and Home Delivery Pharmacy Specialty Technician

## 2023-06-22 MED FILL — DUPIXENT 300 MG/2 ML SUBCUTANEOUS PEN INJECTOR: SUBCUTANEOUS | 28 days supply | Qty: 4 | Fill #6

## 2023-06-30 ENCOUNTER — Other Ambulatory Visit: Payer: Self-pay | Admitting: Family Medicine

## 2023-07-12 NOTE — Unmapped (Signed)
 07/12/2023 - Patient originally requested delivery for 07/19/23. Delivery is not possible on this date due to ups restrictions. I have reached out to the patient and confirmed that delivery on 07/20/23 is ok.  [facility] Specialty and Home Delivery Pharmacy Refill Coordination Note    Adrian Rosario, DOB: 03-21-1975  Phone: There are no phone numbers on file.      All above HIPAA information was verified with patient.         07/09/2023     4:02 PM   Specialty Rx Medication Refill Questionnaire   Which Medications would you like refilled and shipped? Dupixent    Please list all current allergies: None   Have you missed any doses in the last 30 days? No   Have you had any changes to your medication(s) since your last refill? No   How many days remaining of each medication do you have at home? 0   If receiving an injectable medication, next injection date is 07/21/2023   Have you experienced any side effects in the last 30 days? No   Please enter the full address (street address, city, state, zip code) where you would like your medication(s) to be delivered to. [address], [place]  [address]   Please specify on which day you would like your medication(s) to arrive. Note: if you need your medication(s) within 3 days, please call the pharmacy to schedule your order at [phone]  07/19/2023   Has your insurance changed since your last refill? No   Would you like a pharmacist to call you to discuss your medication(s)? No   Do you require a signature for your package? (Note: if we are billing Medicare Part B or your order contains a controlled substance, we will require a signature) No   I have been provided my out of pocket cost for my medication and approve the pharmacy to charge the amount to my credit card on file. Yes         Completed refill call assessment today to schedule patient's medication shipment from the [facility] and Home Delivery Pharmacy [phone]).  All relevant notes have been reviewed. Confirmed patient received a Conservation officer, historic buildings and a Surveyor, mining with first shipment. The patient will receive a drug information handout for each medication shipped and additional FDA Medication Guides as required.         REFERRAL TO PHARMACIST     Referral to the pharmacist: Not needed      [facility]     Shipping address confirmed in Epic.     Delivery Scheduled: Yes, Expected medication delivery date: 07/20/23.     Medication will be delivered via UPS to the prescription address in Epic WAM.    Dena LOISE Gave   [facility] Specialty and Home Delivery Pharmacy Specialty Technician

## 2023-07-19 MED FILL — DUPIXENT 300 MG/2 ML SUBCUTANEOUS PEN INJECTOR: SUBCUTANEOUS | 28 days supply | Qty: 4 | Fill #7

## 2023-08-11 NOTE — Unmapped (Signed)
 [facility] Specialty and Home Delivery Pharmacy Refill Coordination Note    Adrian Rosario, DOB: 19-Apr-1975  Phone: There are no phone numbers on file.      All above HIPAA information was verified with patient.         08/10/2023     2:45 PM   Specialty Rx Medication Refill Questionnaire   Which Medications would you like refilled and shipped? 0   Please list all current allergies: None   Have you missed any doses in the last 30 days? No   Have you had any changes to your medication(s) since your last refill? No   How much of each medication do you have remaining at home? (eg. number of tablets, injections, etc.) 0   If receiving an injectable medication, next injection date is 08/18/2023   Have you experienced any side effects in the last 30 days? No   Please enter the full address (street address, city, state, zip code) where you would like your medication(s) to be delivered to. [address], [place] [address]   Please specify on which day you would like your medication(s) to arrive. Note: if you need your medication(s) within 3 days, please call the pharmacy to schedule your order at [phone]  08/17/2023   Has your insurance changed since your last refill? No   Would you like a pharmacist to call you to discuss your medication(s)? No   Do you require a signature for your package? (Note: if we are billing Medicare Part B or your order contains a controlled substance, we will require a signature) No   I have been provided my out of pocket cost for my medication and approve the pharmacy to charge the amount to my credit card on file. Yes         Completed refill call assessment today to schedule patient's medication shipment from the [facility] and Home Delivery Pharmacy [phone]).  All relevant notes have been reviewed.       Confirmed patient received a Conservation officer, historic buildings and a Surveyor, mining with first shipment. The patient will receive a drug information handout for each medication shipped and additional FDA Medication Guides as required.         REFERRAL TO PHARMACIST     Referral to the pharmacist: Not needed      [facility]     Shipping address confirmed in Epic.     Delivery Scheduled: Yes, Expected medication delivery date: 08/17/23.     Medication will be delivered via UPS to the prescription address in Epic WAM.    Dena LOISE Gave   [facility] Specialty and Home Delivery Pharmacy Specialty Technician

## 2023-08-16 MED FILL — DUPIXENT 300 MG/2 ML SUBCUTANEOUS PEN INJECTOR: SUBCUTANEOUS | 28 days supply | Qty: 4 | Fill #8

## 2023-09-20 NOTE — Unmapped (Signed)
 09/20/2023 - Patient originally requested delivery for 09/20/2023. Delivery is not possible on this date due to UPS restrictions. I have reached out to the patient and confirmed that delivery on 09/22/2023 is ok.    [facility] Specialty and Home Delivery Pharmacy Refill Coordination Note    Adrian Rosario, DOB: 10-18-1975  Phone: There are no phone numbers on file.      All above HIPAA information was verified with patient.         09/15/2023    10:45 AM   Specialty Rx Medication Refill Questionnaire   Which Medications would you like refilled and shipped? Dupixent    Please list all current allergies: None   Have you missed any doses in the last 30 days? No   Have you had any changes to your medication(s) since your last refill? No   How much of each medication do you have remaining at home? (eg. number of tablets, injections, etc.) 0   If receiving an injectable medication, next injection date is 09/22/2023   Have you experienced any side effects in the last 30 days? No   Please enter the full address (street address, city, state, zip code) where you would like your medication(s) to be delivered to. [address], [place]  [address]   Please specify on which day you would like your medication(s) to arrive. Note: if you need your medication(s) within 3 days, please call the pharmacy to schedule your order at [phone]  09/20/2023   Has your insurance changed since your last refill? No   Would you like a pharmacist to call you to discuss your medication(s)? No   Do you require a signature for your package? (Note: if we are billing Medicare Part B or your order contains a controlled substance, we will require a signature) No   I have been provided my out of pocket cost for my medication and approve the pharmacy to charge the amount to my credit card on file. Yes         Completed refill call assessment today to schedule patient's medication shipment from the [facility] and Home Delivery Pharmacy [phone]).  All relevant notes have been reviewed.       Confirmed patient received a Conservation officer, historic buildings and a Surveyor, mining with first shipment. The patient will receive a drug information handout for each medication shipped and additional FDA Medication Guides as required.         REFERRAL TO PHARMACIST     Referral to the pharmacist: Not needed      [facility]     Shipping address confirmed in Epic.     Delivery Scheduled: Yes, Expected medication delivery date: 09/22/2023.     Medication will be delivered via UPS to the prescription address in Epic WAM.     Adrian Rosario   [facility] Specialty and Home Delivery Pharmacy Specialty Technician

## 2023-09-21 MED FILL — DUPIXENT 300 MG/2 ML SUBCUTANEOUS PEN INJECTOR: SUBCUTANEOUS | 28 days supply | Qty: 4 | Fill #9

## 2023-10-06 ENCOUNTER — Other Ambulatory Visit

## 2023-10-15 NOTE — Unmapped (Signed)
 [facility] Specialty and Home Delivery Pharmacy Refill Coordination Note    Adrian Rosario, DOB: May 26, 1975  Phone: There are no phone numbers on file.      All above HIPAA information was verified with patient.         10/14/2023    12:12 PM   Specialty Rx Medication Refill Questionnaire   Which Medications would you like refilled and shipped? Dupixent    Please list all current allergies: None   Have you missed any doses in the last 30 days? No   Have you had any changes to your medication(s) since your last refill? No   How much of each medication do you have remaining at home? (eg. number of tablets, injections, etc.) 0   If receiving an injectable medication, next injection date is 10/27/2023   Have you experienced any side effects in the last 30 days? No   Please enter the full address (street address, city, state, zip code) where you would like your medication(s) to be delivered to. [address], [place] [address]   Please specify on which day you would like your medication(s) to arrive. Note: if you need your medication(s) within 3 days, please call the pharmacy to schedule your order at [phone]  10/26/2023   Has your insurance changed since your last refill? No   Would you like a pharmacist to call you to discuss your medication(s)? No   Do you require a signature for your package? (Note: if we are billing Medicare Part B or your order contains a controlled substance, we will require a signature) No   I have been provided my out of pocket cost for my medication and approve the pharmacy to charge the amount to my credit card on file. Yes         Completed refill call assessment today to schedule patient's medication shipment from the [facility] and Home Delivery Pharmacy [phone]).  All relevant notes have been reviewed.       Confirmed patient received a Conservation officer, historic buildings and a Surveyor, mining with first shipment. The patient will receive a drug information handout for each medication shipped and additional FDA Medication Guides as required.         REFERRAL TO PHARMACIST     Referral to the pharmacist: Not needed      [facility]     Shipping address confirmed in Epic.     Delivery Scheduled: Yes, Expected medication delivery date: 10/14.     Medication will be delivered via UPS to the prescription address in Epic WAM.    Camelia LITTIE [facility] Specialty and Home Delivery Pharmacy Specialty Technician

## 2023-10-25 MED FILL — DUPIXENT 300 MG/2 ML SUBCUTANEOUS PEN INJECTOR: SUBCUTANEOUS | 28 days supply | Qty: 4 | Fill #10

## 2023-11-10 DIAGNOSIS — J3489 Other specified disorders of nose and nasal sinuses: Secondary | ICD-10-CM | POA: Diagnosis not present

## 2023-11-10 DIAGNOSIS — J3081 Allergic rhinitis due to animal (cat) (dog) hair and dander: Secondary | ICD-10-CM | POA: Diagnosis not present

## 2023-11-10 DIAGNOSIS — J4530 Mild persistent asthma, uncomplicated: Secondary | ICD-10-CM | POA: Diagnosis not present

## 2023-11-10 DIAGNOSIS — Z91018 Allergy to other foods: Secondary | ICD-10-CM | POA: Diagnosis not present

## 2023-11-10 MED ORDER — DUPIXENT 300 MG/2 ML SUBCUTANEOUS PEN INJECTOR
SUBCUTANEOUS | 5 refills | 28.00000 days
Start: 2023-11-10 — End: ?

## 2023-11-15 NOTE — Progress Notes (Signed)
 [facility] Refill Coordination Note    Adrian Rosario, DOB: 1975-05-30  Phone: There are no phone numbers on file.      All above HIPAA information was verified with patient.         11/15/2023     7:20 AM   Specialty Rx Medication Refill Questionnaire   Which Medications would you like refilled and shipped? Dupixent    Please list all current allergies: None   Have you missed any doses in the last 30 days? No   Have you had any changes to your medication(s) since your last refill? No   How much of each medication do you have remaining at home? (eg. number of tablets, injections, etc.) 0   If receiving an injectable medication, next injection date is 11/24/2023   Have you experienced any side effects in the last 30 days? No   Please enter the full address (street address, city, state, zip code) where you would like your medication(s) to be delivered to. [address], [place] [address]   Please specify on which day you would like your medication(s) to arrive. Note: if you need your medication(s) within 3 days, please call the pharmacy to schedule your order at [phone]  11/22/2023   Has your insurance changed since your last refill? No   Would you like a pharmacist to call you to discuss your medication(s)? No   Do you require a signature for your package? (Note: if we are billing Medicare Part B or your order contains a controlled substance, we will require a signature) No   I have been provided my out of pocket cost for my medication and approve the pharmacy to charge the amount to my credit card on file. Yes         Completed refill call assessment today to schedule patient's medication shipment from the [facility] [phone]).  All relevant notes have been reviewed.       Confirmed patient received a Conservation Officer, Historic Buildings and a Surveyor, Mining with first shipment. The patient will receive a drug information handout for each medication shipped and additional FDA Medication Guides as required.         REFERRAL TO PHARMACIST     Referral to the pharmacist: Not needed      [facility]     Shipping address confirmed in Epic.     Delivery Scheduled: Yes, Expected medication delivery date: 11/06.     Medication will be delivered via UPS to the prescription address in Epic WAM.    Camelia LITTIE [facility] Specialty Technician

## 2023-11-17 MED FILL — DUPIXENT 300 MG/2 ML SUBCUTANEOUS PEN INJECTOR: SUBCUTANEOUS | 28 days supply | Qty: 4 | Fill #11

## 2023-11-19 ENCOUNTER — Other Ambulatory Visit: Payer: Self-pay

## 2023-11-19 NOTE — Telephone Encounter (Signed)
 Prescription Request  11/19/2023  LOV: 04/05/23  What is the name of the medication or equipment? escitalopram  (LEXAPRO ) 10 MG tablet [[id number]]   Have you contacted your pharmacy to request a refill? Yes   Which pharmacy would you like this sent to?  [id number] - [place],  - [address] [place] [address] Phone: [phone] Fax: [phone]    Patient notified that their request is being sent to the clinical staff for review and that they should receive a response within 2 business days.   Please advise at [facility] [phone]

## 2023-11-20 MED ORDER — ESCITALOPRAM OXALATE 10 MG PO TABS: 10.0000 mg | ORAL_TABLET | Freq: Every day | ORAL | 0 refills | Status: AC

## 2023-11-20 NOTE — Telephone Encounter (Signed)
 LOV 04/06/23  Requested Prescriptions  Pending Prescriptions Disp Refills   escitalopram  (LEXAPRO ) 10 MG tablet 90 tablet 0    Sig: Take 1 tablet (10 mg total) by mouth daily.     Psychiatry:  Antidepressants - SSRI Failed - 11/20/2023  9:49 AM      Failed - Completed PHQ-2 or PHQ-9 in the last 360 days      Failed - Valid encounter within last 6 months    Recent Outpatient Visits           7 months ago Pure hypercholesterolemia   [place] [facility] Pickard, Butler DASEN, MD   1 year ago GAD (generalized anxiety disorder)   [place] [facility] [facility] Duanne Butler DASEN, MD   1 year ago GAD (generalized anxiety disorder)   [place] [facility] Duanne Butler DASEN, MD   1 year ago Seizure [facility])   [place] [facility] Duanne Butler DASEN, MD   2 years ago Fatigue, unspecified type    [facility] Pickard, Butler DASEN, MD

## 2023-11-25 ENCOUNTER — Other Ambulatory Visit: Payer: Self-pay | Admitting: Family Medicine

## 2023-11-25 DIAGNOSIS — I10 Essential (primary) hypertension: Secondary | ICD-10-CM

## 2023-12-07 NOTE — Progress Notes (Signed)
 [facility] Clinical Assessment & Refill Coordination Note    Patient reports doing well on the medication and did not have any questions or concerns at this time.     Adrian Rosario, DOB: 1975-05-19  Phone: There are no phone numbers on file.    All above HIPAA information was verified with patient.     Was a nurse, learning disability used for this call? No    Specialty Medication(s):   Inflammatory Disorders: Dupixent      Current Medications[1]     Changes to medications: Izaha reports no changes at this time.    Medication list has been reviewed and updated in Epic: Yes    Allergies[2]    Changes to allergies: No    Allergies have been reviewed and updated in Epic: Yes    SPECIALTY MEDICATION ADHERENCE     Dupixent  300 mg/ml: 0 doses of medicine on hand       Medication Adherence    Patient reported X missed doses in the last month: 0  Specialty Medication: dupilumab : DUPIXENT  PEN 300 mg/2 mL pen injector  Patient is on additional specialty medications: No  Patient is on more than two specialty medications: No  Informant: patient          Specialty medication(s) dose(s) confirmed: Regimen is correct and unchanged.     Are there any concerns with adherence? No    Adherence counseling provided? Not needed    CLINICAL MANAGEMENT AND INTERVENTION      Clinical Benefit Assessment:    Do you feel the medicine is effective or helping your condition? Yes    Clinical Benefit counseling provided? Not needed    Adverse Effects Assessment:    Are you experiencing any side effects? No    Are you experiencing difficulty administering your medicine? No    Quality of Life Assessment:    Quality of Life    Rheumatology  Oncology  Dermatology  1. What impact has your specialty medication had on the symptoms of your skin condition (i.e. itchiness, soreness, stinging)?: Tremendous  2. What impact has your specialty medication had on your comfort level with your skin?: Tremendous  Cystic Fibrosis          How many days over the past month did your AD  keep you from your normal activities? For example, brushing your teeth or getting up in the morning. 0    Have you discussed this with your provider? Not needed    Acute Infection Status:    Acute infections noted within Epic:  No active infections    Patient reported infection: None    Therapy Appropriateness:    Is the medication and dose appropriate considering the patient???s diagnosis, treatment, and disease journey, comorbidities, medical history, current medications, allergies, therapeutic goals, self-administration ability, and access barriers? Yes, therapy is appropriate and should be continued     Clinical Intervention:    Was an intervention completed as part of this clinical assessment? No    DISEASE/MEDICATION-SPECIFIC INFORMATION      For patients on injectable medications: Next injection is scheduled for 12/22/23.    Chronic Inflammatory Diseases: Have you experienced any flares in the last month? No    PATIENT SPECIFIC NEEDS     Does the patient have any physical, cognitive, or cultural barriers? No    Is the patient high risk? No    Does the patient require physician intervention or other additional services (i.e., nutrition, smoking cessation, social work)?  No    Does the patient have an additional or emergency contact listed in their chart? No, patient refused.    SOCIAL DETERMINANTS OF HEALTH     At the [facility], we have learned that life circumstances - like trouble affording food, housing, utilities, or transportation can affect the health of many of our patients.   That is why we wanted to ask: are you currently experiencing any life circumstances that are negatively impacting your health and/or quality of life? Patient declined to answer    Social Drivers of Health     Food Insecurity: Not on file   Tobacco Use: Not on file   Transportation Needs: Not on file   Alcohol Use: Not on file   Housing: Not on file   Physical Activity: Not on file   Utilities: Not on file   Stress: Not on file   Interpersonal Safety: Not on file   Substance Use: Not on file (12/13/2023)   Intimate Partner Violence: Not on file   Social Connections: Not on file   Financial Resource Strain: Not on file   Health Literacy: Not on file   Internet Connectivity: Not on file       Would you be willing to receive help with any of the needs that you have identified today? Not applicable       SHIPPING     Specialty Medication(s) to be Shipped:   Inflammatory Disorders: Dupixent     Other medication(s) to be shipped: No additional medications requested for fill at this time    Specialty Medications not needed at this time: N/A     Changes to insurance: No    Cost and Payment: Patient has a $0 copay, payment information is not required.    Delivery Scheduled: Yes, Expected medication delivery date: 12/16/23.     Medication will be delivered via UPS to the confirmed prescription address in [facility].    The patient will receive a drug information handout for each medication shipped and additional FDA Medication Guides as required.  Verified that patient has previously received a Conservation Officer, Historic Buildings and a Surveyor, Mining.    The patient or caregiver noted above participated in the development of this care plan and knows that they can request review of or adjustments to the care plan at any time.      All of the patient's questions and concerns have been addressed.    Burnard DELENA Neighbors, PharmD   [facility] Specialty Pharmacist           [1]   Current Outpatient Medications   Medication Sig Dispense Refill    ALPRAZolam (XANAX) 1 MG tablet Take 1 tablet (1 mg total) by mouth every eight (8) hours as needed for anxiety.      escitalopram oxalate (LEXAPRO) 10 MG tablet Take 1 tablet (10 mg total) by mouth daily.      losartan (COZAAR) 50 MG tablet Take 1 tablet (50 mg total) by mouth daily.      dupilumab  (DUPIXENT  PEN) 300 mg/2 mL pen injector Inject the contents of 1 pen (300 mg total) under the skin every fourteen (14) days. 4 mL 5    dupilumab  (DUPIXENT  PEN) 300 mg/2 mL pen injector Inject the contents of 1 pen (300 mg) under the skin every fourteen (14) days. 4 mL 11     No current facility-administered medications for this visit.   [2] No Known Allergies

## 2023-12-07 NOTE — Progress Notes (Deleted)
 Open in error

## 2023-12-15 MED FILL — DUPIXENT 300 MG/2 ML SUBCUTANEOUS PEN INJECTOR: SUBCUTANEOUS | 28 days supply | Qty: 4 | Fill #0

## 2024-01-10 NOTE — Progress Notes (Unsigned)
 The [facility] has made a second and final attempt to reach this patient to refill the following medication:Dupixent  .      We have left voicemails on the following phone numbers: [phone], have sent a MyChart message, have sent a text message to the following phone numbers: [phone], and have sent a Mychart questionnaire..    Dates contacted: 12/23,12/29  Last scheduled delivery: 12/03    The patient may be at risk of non-compliance with this medication. The patient should call the [facility] at [phone]  Option 4, then Option 2: Dermatology, Gastroenterology, Rheumatology to refill medication.    Rilya Longo LITTIE Hope   [facility]
